# Patient Record
Sex: Male | Born: 1980
Health system: Southern US, Community
[De-identification: ages and names within clinical notes are randomized; demographics above are authoritative.]

## PROBLEM LIST (undated history)

## (undated) DIAGNOSIS — E119 Type 2 diabetes mellitus without complications: Secondary | ICD-10-CM

## (undated) DIAGNOSIS — E559 Vitamin D deficiency, unspecified: Secondary | ICD-10-CM

## (undated) DIAGNOSIS — S73004A Unspecified dislocation of right hip, initial encounter: Secondary | ICD-10-CM

## (undated) DIAGNOSIS — I10 Essential (primary) hypertension: Secondary | ICD-10-CM

## (undated) DIAGNOSIS — S82041A Displaced comminuted fracture of right patella, initial encounter for closed fracture: Secondary | ICD-10-CM

## (undated) DIAGNOSIS — S82891A Other fracture of right lower leg, initial encounter for closed fracture: Secondary | ICD-10-CM

## (undated) DIAGNOSIS — E785 Hyperlipidemia, unspecified: Secondary | ICD-10-CM

## (undated) HISTORY — DX: Hyperlipidemia, unspecified: E78.5

## (undated) HISTORY — DX: Essential (primary) hypertension: I10

---

## 2004-06-21 ENCOUNTER — Emergency Department (HOSPITAL_COMMUNITY): Admission: EM | Admit: 2004-06-21 | Discharge: 2004-06-21 | Payer: Self-pay | Admitting: Emergency Medicine

## 2006-09-06 ENCOUNTER — Emergency Department (HOSPITAL_COMMUNITY): Admission: EM | Admit: 2006-09-06 | Discharge: 2006-09-07 | Payer: Self-pay | Admitting: Emergency Medicine

## 2006-09-13 ENCOUNTER — Emergency Department (HOSPITAL_COMMUNITY): Admission: EM | Admit: 2006-09-13 | Discharge: 2006-09-14 | Payer: Self-pay | Admitting: Emergency Medicine

## 2008-04-14 ENCOUNTER — Emergency Department (HOSPITAL_COMMUNITY): Admission: EM | Admit: 2008-04-14 | Discharge: 2008-04-14 | Payer: Self-pay | Admitting: Emergency Medicine

## 2010-06-17 ENCOUNTER — Emergency Department (HOSPITAL_COMMUNITY): Admission: EM | Admit: 2010-06-17 | Discharge: 2010-06-17 | Payer: Self-pay | Admitting: Family Medicine

## 2010-06-19 ENCOUNTER — Emergency Department (HOSPITAL_COMMUNITY): Admission: EM | Admit: 2010-06-19 | Discharge: 2010-06-19 | Payer: Self-pay | Admitting: Family Medicine

## 2017-03-05 ENCOUNTER — Emergency Department (HOSPITAL_COMMUNITY): Payer: BLUE CROSS/BLUE SHIELD

## 2017-03-05 ENCOUNTER — Emergency Department (HOSPITAL_COMMUNITY): Payer: BLUE CROSS/BLUE SHIELD | Admitting: Certified Registered"

## 2017-03-05 ENCOUNTER — Inpatient Hospital Stay (HOSPITAL_COMMUNITY)
Admission: EM | Admit: 2017-03-05 | Discharge: 2017-03-16 | DRG: 481 | Disposition: A | Discharge status: Skilled Nursing Facility | Payer: BLUE CROSS/BLUE SHIELD | Attending: General Surgery | Admitting: General Surgery

## 2017-03-05 ENCOUNTER — Encounter (HOSPITAL_COMMUNITY): Admission: EM | Disposition: A | Discharge status: Skilled Nursing Facility | Payer: Self-pay | Source: Home / Self Care

## 2017-03-05 ENCOUNTER — Inpatient Hospital Stay (HOSPITAL_COMMUNITY): Payer: BLUE CROSS/BLUE SHIELD

## 2017-03-05 ENCOUNTER — Encounter (HOSPITAL_COMMUNITY): Payer: Self-pay | Admitting: Surgery

## 2017-03-05 DIAGNOSIS — R9431 Abnormal electrocardiogram [ECG] [EKG]: Secondary | ICD-10-CM | POA: Diagnosis not present

## 2017-03-05 DIAGNOSIS — R748 Abnormal levels of other serum enzymes: Secondary | ICD-10-CM | POA: Diagnosis not present

## 2017-03-05 DIAGNOSIS — S2241XA Multiple fractures of ribs, right side, initial encounter for closed fracture: Secondary | ICD-10-CM | POA: Diagnosis present

## 2017-03-05 DIAGNOSIS — T148XXA Other injury of unspecified body region, initial encounter: Secondary | ICD-10-CM

## 2017-03-05 DIAGNOSIS — S82041A Displaced comminuted fracture of right patella, initial encounter for closed fracture: Secondary | ICD-10-CM | POA: Diagnosis present

## 2017-03-05 DIAGNOSIS — Z833 Family history of diabetes mellitus: Secondary | ICD-10-CM | POA: Diagnosis not present

## 2017-03-05 DIAGNOSIS — R0602 Shortness of breath: Secondary | ICD-10-CM

## 2017-03-05 DIAGNOSIS — Z09 Encounter for follow-up examination after completed treatment for conditions other than malignant neoplasm: Secondary | ICD-10-CM

## 2017-03-05 DIAGNOSIS — S73004A Unspecified dislocation of right hip, initial encounter: Principal | ICD-10-CM | POA: Diagnosis present

## 2017-03-05 DIAGNOSIS — S301XXA Contusion of abdominal wall, initial encounter: Secondary | ICD-10-CM | POA: Diagnosis present

## 2017-03-05 DIAGNOSIS — E54 Ascorbic acid deficiency: Secondary | ICD-10-CM | POA: Diagnosis present

## 2017-03-05 DIAGNOSIS — S91311A Laceration without foreign body, right foot, initial encounter: Secondary | ICD-10-CM | POA: Diagnosis present

## 2017-03-05 DIAGNOSIS — E1065 Type 1 diabetes mellitus with hyperglycemia: Secondary | ICD-10-CM | POA: Diagnosis present

## 2017-03-05 DIAGNOSIS — S61216A Laceration without foreign body of right little finger without damage to nail, initial encounter: Secondary | ICD-10-CM | POA: Diagnosis present

## 2017-03-05 DIAGNOSIS — T1490XA Injury, unspecified, initial encounter: Secondary | ICD-10-CM

## 2017-03-05 DIAGNOSIS — T07XXXA Unspecified multiple injuries, initial encounter: Secondary | ICD-10-CM

## 2017-03-05 DIAGNOSIS — D62 Acute posthemorrhagic anemia: Secondary | ICD-10-CM | POA: Diagnosis not present

## 2017-03-05 DIAGNOSIS — S82891A Other fracture of right lower leg, initial encounter for closed fracture: Secondary | ICD-10-CM | POA: Diagnosis present

## 2017-03-05 DIAGNOSIS — E872 Acidosis: Secondary | ICD-10-CM | POA: Diagnosis present

## 2017-03-05 DIAGNOSIS — Z419 Encounter for procedure for purposes other than remedying health state, unspecified: Secondary | ICD-10-CM

## 2017-03-05 DIAGNOSIS — S270XXA Traumatic pneumothorax, initial encounter: Secondary | ICD-10-CM | POA: Diagnosis present

## 2017-03-05 DIAGNOSIS — E1165 Type 2 diabetes mellitus with hyperglycemia: Secondary | ICD-10-CM | POA: Diagnosis not present

## 2017-03-05 DIAGNOSIS — Y92410 Unspecified street and highway as the place of occurrence of the external cause: Secondary | ICD-10-CM

## 2017-03-05 DIAGNOSIS — Z880 Allergy status to penicillin: Secondary | ICD-10-CM

## 2017-03-05 DIAGNOSIS — S62002A Unspecified fracture of navicular [scaphoid] bone of left wrist, initial encounter for closed fracture: Secondary | ICD-10-CM | POA: Diagnosis present

## 2017-03-05 DIAGNOSIS — S2239XA Fracture of one rib, unspecified side, initial encounter for closed fracture: Secondary | ICD-10-CM

## 2017-03-05 DIAGNOSIS — S2249XA Multiple fractures of ribs, unspecified side, initial encounter for closed fracture: Secondary | ICD-10-CM

## 2017-03-05 DIAGNOSIS — S82841A Displaced bimalleolar fracture of right lower leg, initial encounter for closed fracture: Secondary | ICD-10-CM | POA: Diagnosis present

## 2017-03-05 DIAGNOSIS — E559 Vitamin D deficiency, unspecified: Secondary | ICD-10-CM | POA: Diagnosis present

## 2017-03-05 DIAGNOSIS — R079 Chest pain, unspecified: Secondary | ICD-10-CM

## 2017-03-05 DIAGNOSIS — M25551 Pain in right hip: Secondary | ICD-10-CM | POA: Diagnosis present

## 2017-03-05 DIAGNOSIS — S50811A Abrasion of right forearm, initial encounter: Secondary | ICD-10-CM | POA: Diagnosis present

## 2017-03-05 DIAGNOSIS — S81801A Unspecified open wound, right lower leg, initial encounter: Secondary | ICD-10-CM | POA: Diagnosis present

## 2017-03-05 DIAGNOSIS — E119 Type 2 diabetes mellitus without complications: Secondary | ICD-10-CM

## 2017-03-05 HISTORY — PX: HIP CLOSED REDUCTION: SHX983

## 2017-03-05 HISTORY — DX: Other fracture of right lower leg, initial encounter for closed fracture: S82.891A

## 2017-03-05 HISTORY — DX: Unspecified dislocation of right hip, initial encounter: S73.004A

## 2017-03-05 HISTORY — DX: Displaced comminuted fracture of right patella, initial encounter for closed fracture: S82.041A

## 2017-03-05 HISTORY — DX: Vitamin D deficiency, unspecified: E55.9

## 2017-03-05 HISTORY — PX: I & D EXTREMITY: SHX5045

## 2017-03-05 LAB — CBC
HEMATOCRIT: 47.3 % (ref 39.0–52.0)
Hemoglobin: 16.6 g/dL (ref 13.0–17.0)
MCH: 31.4 pg (ref 26.0–34.0)
MCHC: 35.1 g/dL (ref 30.0–36.0)
MCV: 89.4 fL (ref 78.0–100.0)
Platelets: 239 10*3/uL (ref 150–400)
RBC: 5.29 MIL/uL (ref 4.22–5.81)
RDW: 12.6 % (ref 11.5–15.5)
WBC: 11.2 10*3/uL — ABNORMAL HIGH (ref 4.0–10.5)

## 2017-03-05 LAB — LACTIC ACID, PLASMA: Lactic Acid, Venous: 5.4 mmol/L (ref 0.5–1.9)

## 2017-03-05 LAB — COMPREHENSIVE METABOLIC PANEL
ALBUMIN: 3.8 g/dL (ref 3.5–5.0)
ALK PHOS: 82 U/L (ref 38–126)
ALT: 321 U/L — ABNORMAL HIGH (ref 17–63)
AST: 484 U/L — AB (ref 15–41)
Anion gap: 13 (ref 5–15)
BILIRUBIN TOTAL: 1.3 mg/dL — AB (ref 0.3–1.2)
BUN: 10 mg/dL (ref 6–20)
CALCIUM: 9.3 mg/dL (ref 8.9–10.3)
CO2: 22 mmol/L (ref 22–32)
Chloride: 101 mmol/L (ref 101–111)
Creatinine, Ser: 0.84 mg/dL (ref 0.61–1.24)
GFR calc Af Amer: >60 mL/min (ref >60)
GFR calc non Af Amer: >60 mL/min (ref >60)
GLUCOSE: 401 mg/dL — AB (ref 65–99)
Potassium: 3.5 mmol/L (ref 3.5–5.1)
SODIUM: 136 mmol/L (ref 135–145)
TOTAL PROTEIN: 7 g/dL (ref 6.5–8.1)

## 2017-03-05 LAB — I-STAT CHEM 8, ED
BUN: 14 mg/dL (ref 6–20)
CHLORIDE: 104 mmol/L (ref 101–111)
Calcium, Ion: 1.1 mmol/L — ABNORMAL LOW (ref 1.15–1.40)
Creatinine, Ser: 0.7 mg/dL (ref 0.61–1.24)
Glucose, Bld: 401 mg/dL — ABNORMAL HIGH (ref 65–99)
HEMATOCRIT: 48 % (ref 39.0–52.0)
Hemoglobin: 16.3 g/dL (ref 13.0–17.0)
POTASSIUM: 3.4 mmol/L — AB (ref 3.5–5.1)
SODIUM: 138 mmol/L (ref 135–145)
TCO2: 24 mmol/L (ref 0–100)

## 2017-03-05 LAB — PROTIME-INR
INR: 0.98
Prothrombin Time: 13 seconds (ref 11.4–15.2)

## 2017-03-05 LAB — GLUCOSE, CAPILLARY
GLUCOSE-CAPILLARY: 331 mg/dL — AB (ref 65–99)
GLUCOSE-CAPILLARY: 271 mg/dL — AB (ref 65–99)
GLUCOSE-CAPILLARY: 333 mg/dL — AB (ref 65–99)
Glucose-Capillary: 348 mg/dL — ABNORMAL HIGH (ref 65–99)

## 2017-03-05 LAB — I-STAT CG4 LACTIC ACID, ED: LACTIC ACID, VENOUS: 4.91 mmol/L — AB (ref 0.5–1.9)

## 2017-03-05 LAB — SAMPLE TO BLOOD BANK

## 2017-03-05 LAB — ETHANOL: Alcohol, Ethyl (B): <5 mg/dL (ref <5)

## 2017-03-05 LAB — CDS SEROLOGY

## 2017-03-05 SURGERY — IRRIGATION AND DEBRIDEMENT EXTREMITY
Anesthesia: General | Laterality: Right

## 2017-03-05 MED ORDER — IOPAMIDOL (ISOVUE-300) INJECTION 61%
INTRAVENOUS | Status: AC
  Filled 2017-03-05: qty 100

## 2017-03-05 MED ORDER — MIDAZOLAM HCL 2 MG/2ML IJ SOLN
INTRAMUSCULAR | Status: AC
  Filled 2017-03-05: qty 2

## 2017-03-05 MED ORDER — SUGAMMADEX SODIUM 200 MG/2ML IV SOLN
INTRAVENOUS | Status: DC | PRN
  Administered 2017-03-05: 215 mg via INTRAVENOUS

## 2017-03-05 MED ORDER — CEFAZOLIN SODIUM-DEXTROSE 2-4 GM/100ML-% IV SOLN
2.0000 g | Freq: Once | INTRAVENOUS | Status: AC
  Administered 2017-03-05: 2 g via INTRAVENOUS

## 2017-03-05 MED ORDER — HYDROMORPHONE HCL 1 MG/ML IJ SOLN: 0.2500 mg | INTRAMUSCULAR | Status: DC | PRN

## 2017-03-05 MED ORDER — CEFAZOLIN SODIUM 1 G IJ SOLR
INTRAMUSCULAR | Status: AC
  Filled 2017-03-05: qty 20

## 2017-03-05 MED ORDER — OXYCODONE HCL 5 MG/5ML PO SOLN: 5.0000 mg | Freq: Once | ORAL | Status: DC | PRN

## 2017-03-05 MED ORDER — ROCURONIUM BROMIDE 10 MG/ML (PF) SYRINGE
PREFILLED_SYRINGE | INTRAVENOUS | Status: AC
  Filled 2017-03-05: qty 5

## 2017-03-05 MED ORDER — FENTANYL CITRATE (PF) 100 MCG/2ML IJ SOLN
INTRAMUSCULAR | Status: AC
  Administered 2017-03-05: 100 ug
  Filled 2017-03-05: qty 2

## 2017-03-05 MED ORDER — PROPOFOL 10 MG/ML IV BOLUS
INTRAVENOUS | Status: AC
  Filled 2017-03-05: qty 20

## 2017-03-05 MED ORDER — HYDROMORPHONE HCL 1 MG/ML IJ SOLN
1.0000 mg | INTRAMUSCULAR | Status: DC | PRN
  Administered 2017-03-06 – 2017-03-13 (×18): 1 mg via INTRAVENOUS
  Filled 2017-03-05 (×18): qty 1

## 2017-03-05 MED ORDER — ROCURONIUM BROMIDE 100 MG/10ML IV SOLN
INTRAVENOUS | Status: DC | PRN
  Administered 2017-03-05: 30 mg via INTRAVENOUS
  Administered 2017-03-05: 100 mg via INTRAVENOUS

## 2017-03-05 MED ORDER — PROPOFOL 10 MG/ML IV BOLUS
1.0000 mg/kg | Freq: Once | INTRAVENOUS | Status: AC
Start: 2017-03-05 — End: 2017-03-05
  Administered 2017-03-05: 100 mg via INTRAVENOUS
  Filled 2017-03-05: qty 20

## 2017-03-05 MED ORDER — CEFAZOLIN SODIUM 1 G IJ SOLR
INTRAMUSCULAR | Status: DC | PRN
  Administered 2017-03-05: 2 g via INTRAMUSCULAR

## 2017-03-05 MED ORDER — MIDAZOLAM HCL 2 MG/2ML IJ SOLN
INTRAMUSCULAR | Status: DC | PRN
  Administered 2017-03-05 (×2): 2 mg via INTRAVENOUS

## 2017-03-05 MED ORDER — ONDANSETRON HCL 4 MG/2ML IJ SOLN
INTRAMUSCULAR | Status: DC | PRN
  Administered 2017-03-05: 4 mg via INTRAVENOUS

## 2017-03-05 MED ORDER — OXYCODONE HCL 5 MG PO TABS
5.0000 mg | ORAL_TABLET | ORAL | Status: DC | PRN
  Administered 2017-03-05 – 2017-03-06 (×2): 10 mg via ORAL
  Filled 2017-03-05 (×3): qty 2

## 2017-03-05 MED ORDER — SODIUM CHLORIDE 0.9 % IR SOLN
Status: DC | PRN
  Administered 2017-03-05: 3000 mL

## 2017-03-05 MED ORDER — OXYCODONE HCL 5 MG PO TABS: 5.0000 mg | ORAL_TABLET | Freq: Once | ORAL | Status: DC | PRN

## 2017-03-05 MED ORDER — FENTANYL CITRATE (PF) 250 MCG/5ML IJ SOLN
INTRAMUSCULAR | Status: AC
  Filled 2017-03-05: qty 5

## 2017-03-05 MED ORDER — PANTOPRAZOLE SODIUM 40 MG IV SOLR
40.0000 mg | Freq: Every day | INTRAVENOUS | Status: DC
  Filled 2017-03-05: qty 40

## 2017-03-05 MED ORDER — DOCUSATE SODIUM 100 MG PO CAPS
100.0000 mg | ORAL_CAPSULE | Freq: Two times a day (BID) | ORAL | Status: DC
  Administered 2017-03-05 – 2017-03-16 (×18): 100 mg via ORAL
  Filled 2017-03-05 (×22): qty 1

## 2017-03-05 MED ORDER — PROPOFOL 10 MG/ML IV BOLUS: 100.0000 mg | Freq: Once | INTRAVENOUS | Status: DC

## 2017-03-05 MED ORDER — INSULIN ASPART 100 UNIT/ML ~~LOC~~ SOLN
0.0000 [IU] | Freq: Every day | SUBCUTANEOUS | Status: DC
  Administered 2017-03-05: 4 [IU] via SUBCUTANEOUS
  Administered 2017-03-06: 2 [IU] via SUBCUTANEOUS
  Administered 2017-03-10: 3 [IU] via SUBCUTANEOUS

## 2017-03-05 MED ORDER — INSULIN ASPART 100 UNIT/ML ~~LOC~~ SOLN
0.0000 [IU] | Freq: Three times a day (TID) | SUBCUTANEOUS | Status: DC
  Administered 2017-03-06: 5 [IU] via SUBCUTANEOUS
  Administered 2017-03-06: 8 [IU] via SUBCUTANEOUS
  Administered 2017-03-06: 5 [IU] via SUBCUTANEOUS
  Administered 2017-03-07 (×2): 3 [IU] via SUBCUTANEOUS
  Administered 2017-03-07 – 2017-03-08 (×2): 5 [IU] via SUBCUTANEOUS
  Administered 2017-03-08 – 2017-03-09 (×3): 3 [IU] via SUBCUTANEOUS
  Administered 2017-03-10: 8 [IU] via SUBCUTANEOUS
  Administered 2017-03-10 (×2): 5 [IU] via SUBCUTANEOUS
  Administered 2017-03-11: 8 [IU] via SUBCUTANEOUS
  Administered 2017-03-11: 3 [IU] via SUBCUTANEOUS
  Administered 2017-03-11: 8 [IU] via SUBCUTANEOUS
  Administered 2017-03-12: 5 [IU] via SUBCUTANEOUS
  Administered 2017-03-12: 8 [IU] via SUBCUTANEOUS
  Administered 2017-03-12: 3 [IU] via SUBCUTANEOUS

## 2017-03-05 MED ORDER — FENTANYL CITRATE (PF) 100 MCG/2ML IJ SOLN
INTRAMUSCULAR | Status: AC
  Administered 2017-03-05: 100 ug via INTRAVENOUS
  Filled 2017-03-05: qty 2

## 2017-03-05 MED ORDER — FENTANYL CITRATE (PF) 100 MCG/2ML IJ SOLN
100.0000 ug | Freq: Once | INTRAMUSCULAR | Status: AC
  Administered 2017-03-05: 100 ug via INTRAVENOUS

## 2017-03-05 MED ORDER — ONDANSETRON HCL 4 MG/2ML IJ SOLN
INTRAMUSCULAR | Status: AC
  Filled 2017-03-05: qty 2

## 2017-03-05 MED ORDER — IOPAMIDOL (ISOVUE-300) INJECTION 61%
INTRAVENOUS | Status: AC
  Administered 2017-03-05: 100 mL
  Filled 2017-03-05: qty 100

## 2017-03-05 MED ORDER — FENTANYL CITRATE (PF) 100 MCG/2ML IJ SOLN
INTRAMUSCULAR | Status: DC | PRN
  Administered 2017-03-05: 150 ug via INTRAVENOUS
  Administered 2017-03-05 (×2): 50 ug via INTRAVENOUS

## 2017-03-05 MED ORDER — LACTATED RINGERS IV SOLN
INTRAVENOUS | Status: DC
  Administered 2017-03-05 (×2): via INTRAVENOUS

## 2017-03-05 MED ORDER — LIDOCAINE 2% (20 MG/ML) 5 ML SYRINGE
INTRAMUSCULAR | Status: AC
  Filled 2017-03-05: qty 5

## 2017-03-05 MED ORDER — SODIUM CHLORIDE 0.9 % IV SOLN
INTRAVENOUS | Status: DC
  Administered 2017-03-05 – 2017-03-13 (×8): via INTRAVENOUS

## 2017-03-05 MED ORDER — PROPOFOL 10 MG/ML IV BOLUS
INTRAVENOUS | Status: AC | PRN
  Administered 2017-03-05: 100 mg via INTRAVENOUS

## 2017-03-05 MED ORDER — ONDANSETRON HCL 4 MG/2ML IJ SOLN: 4.0000 mg | Freq: Four times a day (QID) | INTRAMUSCULAR | Status: DC | PRN

## 2017-03-05 MED ORDER — TETANUS-DIPHTH-ACELL PERTUSSIS 5-2.5-18.5 LF-MCG/0.5 IM SUSP
INTRAMUSCULAR | Status: AC
  Administered 2017-03-05: 0.5 mL via INTRAMUSCULAR
  Filled 2017-03-05: qty 0.5

## 2017-03-05 MED ORDER — ONDANSETRON HCL 4 MG PO TABS: 4.0000 mg | ORAL_TABLET | Freq: Four times a day (QID) | ORAL | Status: DC | PRN

## 2017-03-05 MED ORDER — PANTOPRAZOLE SODIUM 40 MG PO TBEC
40.0000 mg | DELAYED_RELEASE_TABLET | Freq: Every day | ORAL | Status: DC
  Administered 2017-03-06 – 2017-03-16 (×10): 40 mg via ORAL
  Filled 2017-03-05 (×11): qty 1

## 2017-03-05 MED ORDER — CLINDAMYCIN PHOSPHATE 600 MG/50ML IV SOLN
600.0000 mg | Freq: Two times a day (BID) | INTRAVENOUS | Status: AC
  Administered 2017-03-05 – 2017-03-07 (×4): 600 mg via INTRAVENOUS
  Filled 2017-03-05 (×4): qty 50

## 2017-03-05 MED ORDER — ACETAMINOPHEN 325 MG PO TABS: 650.0000 mg | ORAL_TABLET | ORAL | Status: DC | PRN

## 2017-03-05 MED ORDER — PROPOFOL 10 MG/ML IV BOLUS
INTRAVENOUS | Status: AC | PRN
  Administered 2017-03-05: 107 mg via INTRAVENOUS

## 2017-03-05 MED ORDER — TETANUS-DIPHTH-ACELL PERTUSSIS 5-2.5-18.5 LF-MCG/0.5 IM SUSP
0.5000 mL | Freq: Once | INTRAMUSCULAR | Status: AC
  Administered 2017-03-05: 0.5 mL via INTRAMUSCULAR

## 2017-03-05 SURGICAL SUPPLY — 59 items
BANDAGE ACE 6X5 VEL STRL LF (GAUZE/BANDAGES/DRESSINGS) ×6 IMPLANT
BNDG COHESIVE 4X5 TAN STRL (GAUZE/BANDAGES/DRESSINGS) ×3 IMPLANT
BNDG GAUZE ELAST 4 BULKY (GAUZE/BANDAGES/DRESSINGS) ×4 IMPLANT
BNDG GAUZE STRTCH 6 (GAUZE/BANDAGES/DRESSINGS) ×9 IMPLANT
BRUSH SCRUB SURG 4.25 DISP (MISCELLANEOUS) ×6 IMPLANT
COVER PERINEAL POST (MISCELLANEOUS) ×3 IMPLANT
COVER SURGICAL LIGHT HANDLE (MISCELLANEOUS) ×6 IMPLANT
DRAPE C-ARMOR (DRAPES) ×3 IMPLANT
DRAPE STERI IOBAN 125X83 (DRAPES) ×3 IMPLANT
DRAPE U-SHAPE 47X51 STRL (DRAPES) ×3 IMPLANT
DRSG ADAPTIC 3X8 NADH LF (GAUZE/BANDAGES/DRESSINGS) ×3 IMPLANT
DRSG MEPILEX BORDER 4X4 (GAUZE/BANDAGES/DRESSINGS) ×3 IMPLANT
DRSG MEPITEL 4X7.2 (GAUZE/BANDAGES/DRESSINGS) ×8 IMPLANT
ELECT REM PT RETURN 9FT ADLT (ELECTROSURGICAL) ×3
ELECTRODE REM PT RTRN 9FT ADLT (ELECTROSURGICAL) ×1 IMPLANT
GAUZE SPONGE 4X4 12PLY STRL (GAUZE/BANDAGES/DRESSINGS) ×5 IMPLANT
GLOVE BIO SURGEON STRL SZ7.5 (GLOVE) ×3 IMPLANT
GLOVE BIO SURGEON STRL SZ8 (GLOVE) ×3 IMPLANT
GLOVE BIOGEL PI IND STRL 7.5 (GLOVE) ×1 IMPLANT
GLOVE BIOGEL PI IND STRL 8 (GLOVE) ×1 IMPLANT
GLOVE BIOGEL PI INDICATOR 7.5 (GLOVE) ×2
GLOVE BIOGEL PI INDICATOR 8 (GLOVE) ×2
GOWN STRL REUS W/ TWL LRG LVL3 (GOWN DISPOSABLE) ×2 IMPLANT
GOWN STRL REUS W/ TWL XL LVL3 (GOWN DISPOSABLE) ×1 IMPLANT
GOWN STRL REUS W/TWL LRG LVL3 (GOWN DISPOSABLE) ×6
GOWN STRL REUS W/TWL XL LVL3 (GOWN DISPOSABLE) ×3
HANDPIECE INTERPULSE COAX TIP (DISPOSABLE)
KIT BASIN OR (CUSTOM PROCEDURE TRAY) ×3 IMPLANT
KIT ROOM TURNOVER OR (KITS) ×3 IMPLANT
LINER BOOT UNIVERSAL DISP (MISCELLANEOUS) ×3 IMPLANT
MANIFOLD NEPTUNE II (INSTRUMENTS) ×3 IMPLANT
NS IRRIG 1000ML POUR BTL (IV SOLUTION) ×3 IMPLANT
PACK GENERAL/GYN (CUSTOM PROCEDURE TRAY) ×3 IMPLANT
PACK ORTHO EXTREMITY (CUSTOM PROCEDURE TRAY) ×3 IMPLANT
PAD ABD 8X10 STRL (GAUZE/BANDAGES/DRESSINGS) ×4 IMPLANT
PAD ARMBOARD 7.5X6 YLW CONV (MISCELLANEOUS) ×6 IMPLANT
PAD CAST 4YDX4 CTTN HI CHSV (CAST SUPPLIES) IMPLANT
PADDING CAST COTTON 4X4 STRL (CAST SUPPLIES) ×9
PADDING CAST COTTON 6X4 STRL (CAST SUPPLIES) ×3 IMPLANT
SET HNDPC FAN SPRY TIP SCT (DISPOSABLE) IMPLANT
SPONGE LAP 18X18 X RAY DECT (DISPOSABLE) ×3 IMPLANT
STAPLER VISISTAT 35W (STAPLE) ×3 IMPLANT
STOCKINETTE IMPERVIOUS 9X36 MD (GAUZE/BANDAGES/DRESSINGS) ×3 IMPLANT
SUT PDS AB 2-0 CT1 27 (SUTURE) IMPLANT
SUT VIC AB 0 CT1 27 (SUTURE) ×3
SUT VIC AB 0 CT1 27XBRD ANBCTR (SUTURE) ×1 IMPLANT
SUT VIC AB 1 CT1 27 (SUTURE) ×3
SUT VIC AB 1 CT1 27XBRD ANBCTR (SUTURE) ×1 IMPLANT
SUT VIC AB 2-0 CT1 27 (SUTURE) ×3
SUT VIC AB 2-0 CT1 TAPERPNT 27 (SUTURE) ×1 IMPLANT
SWAB CULTURE ESWAB REG 1ML (MISCELLANEOUS) IMPLANT
TOWEL OR 17X24 6PK STRL BLUE (TOWEL DISPOSABLE) ×3 IMPLANT
TOWEL OR 17X26 10 PK STRL BLUE (TOWEL DISPOSABLE) ×6 IMPLANT
TRAY FOLEY CATH 14FRSI W/METER (CATHETERS) ×2 IMPLANT
TUBE CONNECTING 12'X1/4 (SUCTIONS) ×1
TUBE CONNECTING 12X1/4 (SUCTIONS) ×2 IMPLANT
UNDERPAD 30X30 (UNDERPADS AND DIAPERS) ×3 IMPLANT
WATER STERILE IRR 1000ML POUR (IV SOLUTION) ×3 IMPLANT
YANKAUER SUCT BULB TIP NO VENT (SUCTIONS) ×3 IMPLANT

## 2017-03-05 NOTE — Anesthesia Procedure Notes (Signed)
Procedure Name: Intubation Date/Time: 03/05/2017 5:42 PM Performed by: Sampson Si E Pre-anesthesia Checklist: Patient identified, Emergency Drugs available, Suction available and Patient being monitored Patient Re-evaluated:Patient Re-evaluated prior to inductionOxygen Delivery Method: Circle System Utilized Preoxygenation: Pre-oxygenation with 100% oxygen Intubation Type: IV induction Ventilation: Mask ventilation without difficulty Laryngoscope Size: Mac and 4 Grade View: Grade I Tube type: Oral Tube size: 7.5 mm Number of attempts: 1 Airway Equipment and Method: Stylet and Oral airway Placement Confirmation: ETT inserted through vocal cords under direct vision,  positive ETCO2 and breath sounds checked- equal and bilateral Secured at: 21 cm Tube secured with: Tape Dental Injury: Teeth and Oropharynx as per pre-operative assessment
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
## 2017-03-05 NOTE — ED Notes (Signed)
MD wyatt at bedside. Report called to OR.

## 2017-03-05 NOTE — Anesthesia Preprocedure Evaluation (Addendum)
Anesthesia Evaluation  Patient identified by MRN, date of birth, ID band Patient awake    Reviewed: Allergy & Precautions, NPO status , Patient's Chart, lab work & pertinent test results  History of Anesthesia Complications Negative for: history of anesthetic complications  Airway Mallampati: II  TM Distance: >3 FB Neck ROM: Full    Dental  (+) Teeth Intact, Chipped, Dental Advisory Given,    Pulmonary neg pulmonary ROS,    breath sounds clear to auscultation       Cardiovascular negative cardio ROS   Rhythm:Regular     Neuro/Psych negative neurological ROS  negative psych ROS   GI/Hepatic negative GI ROS, Neg liver ROS,   Endo/Other  negative endocrine ROS  Renal/GU negative Renal ROS     Musculoskeletal   Abdominal   Peds  Hematology negative hematology ROS (+)   Anesthesia Other Findings   Reproductive/Obstetrics                            Anesthesia Physical Anesthesia Plan  ASA: I  Anesthesia Plan: General   Post-op Pain Management:    Induction: Intravenous, Rapid sequence and Cricoid pressure planned  Airway Management Planned: Oral ETT  Additional Equipment: None  Intra-op Plan:   Post-operative Plan: Extubation in OR and Possible Post-op intubation/ventilation  Informed Consent: I have reviewed the patients History and Physical, chart, labs and discussed the procedure including the risks, benefits and alternatives for the proposed anesthesia with the patient or authorized representative who has indicated his/her understanding and acceptance.   Dental advisory given  Plan Discussed with: CRNA and Surgeon  Anesthesia Plan Comments:         Anesthesia Quick Evaluation
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
## 2017-03-05 NOTE — Progress Notes (Signed)
   03/05/17 1135  Clinical Encounter Type  Visited With Patient  Visit Type ED  Spiritual Encounters  Spiritual Needs Emotional  Stress Factors  Patient Stress Factors Health changes  Introduction to Pt. Got number of relative to call. Left voice message.

## 2017-03-05 NOTE — Brief Op Note (Signed)
03/05/2017  8:14 PM  PATIENT:  Walter Hansen  36 y.o. male  PRE-OPERATIVE DIAGNOSES:   1. RIGHT HIP TRAUMATIC AVULSION/ IMPACTION FRACTURE DISLOCATION 2. RIGHT LEG WOUND WITH DISRUPTED TIBIAL PERIOSTEUM  3. RIGHT ANKLE BIMALLEOLAR FRACTURE 4. RIGHT ANKLE POSTERIOR LACERATION 7 CM 5. RIGHT RING AND SMALL FINGER LACERATIONS 6. LEFT THUMB LACERATION 7. LEFT SCAPHOID FRACTURE 8. RIGHT PATELLA FRACTURE  POST-OPERATIVE DIAGNOSIS:   1. RIGHT HIP TRAUMATIC AVULSION/ IMPACTION FRACTURE DISLOCATION 2. RIGHT LEG WOUND WITH DISRUPTED TIBIAL PERIOSTEUM  3. RIGHT ANKLE BIMALLEOLAR FRACTURE 4. RIGHT ANKLE POSTERIOR LACERATION 7 CM 5. RIGHT RING AND SMALL FINGER LACERATIONS 6. LEFT THUMB LACERATION 7. LEFT SCAPHOID FRACTURE 8. RIGHT PATELLA FRACTURE  PROCEDURE:  Procedure(s): 1. CLOSED REDUCTION OF RIGHT HIP TRAUMATIC DISLOCATION 2. IRRIGATION AND DEBRIDEMENT RIGHT TIBIA FRACTURE, SKIN, SUBCU, FASCIA 3. IRRIGATION AND DEBRIDEMENT ANKLE AND LEG (Right) TRAUMATIC WOUND 4. LAYERED CLOSURE RIGHT ANKLE 7CM 5. IRRIGATION AND DEBRIDEMENT RING AND SMALL FINGER (Right) TRAUMATIC WOUNDS 6. DEBRIDEMENT RIGHT RING AND SMALL FINGER WOUNDS, LEFT THUMB WOUND SKIN AND SUBCU  SURGEON:  Surgeon(s) and Role:    [name]* Jeydan Barner, MD - Primary  PHYSICIAN ASSISTANT: Montez MoritaKEITH PAUL, PA-C  ANESTHESIA:   general  EBL:  Total I/O In: 2500 [I.V.:2500] Out: 130 [Urine:100; Blood:30]  BLOOD ADMINISTERED:none  DRAINS: none   LOCAL MEDICATIONS USED:  NONE  SPECIMEN:  No Specimen  DISPOSITION OF SPECIMEN:  N/A  COUNTS:  YES  TOURNIQUET:  * No tourniquets in log *  DICTATIO: [id number]: [id number]  PLAN OF CARE: Admit to inpatient   PATIENT DISPOSITION:  PACU - hemodynamically stable.   Delay start of Pharmacological VTE agent (>24hrs) due to surgical blood loss or risk of bleeding: no

## 2017-03-05 NOTE — ED Notes (Signed)
Right hip still out of place on repeat xray. Will repeat conscious sedation and repeat procedure.

## 2017-03-05 NOTE — Progress Notes (Signed)
Orthopedic Tech Progress Note Patient Details:  Walter HaradaWilliam Hansen 03/27/1981 [id number]  Musculoskeletal Traction Type of Traction: Other (Comment) Traction Location: traction setup on bed as requested by Doctor.  10lbs and 5lbs provided. Traction Weight: 10 lbs    Walter ChouWilliams, Walter Hansen 03/05/2017, 5:36 PM

## 2017-03-05 NOTE — ED Notes (Signed)
Lab results turned in to Dr. Jeraldine LootsLockwood.

## 2017-03-05 NOTE — ED Notes (Signed)
MD Jeraldine LootsLockwood and Ortho PA Dale DurhamMichael Jeffries unable to reduce hip. Plan to go to OR.

## 2017-03-05 NOTE — Transfer of Care (Signed)
Immediate Anesthesia Transfer of Care Note  Patient: Walter Hansen  Procedure(s) Performed: Procedure(s): IRRIGATION AND DEBRIDEMENT ANKLE AND LEG (Right) CLOSED REDUCTION HIP (Right)  Patient Location: PACU  Anesthesia Type:General  Level of Consciousness: awake and alert   Airway & Oxygen Therapy: Patient Spontanous Breathing and Patient connected to nasal cannula oxygen  Post-op Assessment: Report given to RN and Post -op Vital signs reviewed and stable  Post vital signs: Reviewed and stable  Last Vitals:  Vitals:   03/05/17 1500 03/05/17 1554  BP: (!) 108/55   Pulse: (!) 116   Resp: (!) 33   Temp:  36.7 C    Last Pain:  Vitals:   03/05/17 1554  TempSrc: Tympanic  PainSc:          Complications: No apparent anesthesia complications

## 2017-03-05 NOTE — ED Notes (Signed)
Ortho PA at bedside.  

## 2017-03-05 NOTE — ED Notes (Signed)
CT unable to scan pt at this time.

## 2017-03-05 NOTE — Progress Notes (Signed)
Orthopedic Tech Progress Note Patient Details:  Walter HaradaWilliam Hansen 08/06/1981 [id number]  Ortho Devices Type of Ortho Device: Knee Immobilizer Ortho Device/Splint Location: rle Ortho Device/Splint Interventions: Application   Edson Deridder 03/05/2017, 3:07 PM

## 2017-03-05 NOTE — Progress Notes (Signed)
Orthopedic Tech Progress Note Patient Details:  Walter Hansen 04/04/1981 [id number]  Patient ID: Walter HaradaWilliam Hansen, male   DOB: 01/04/1981, 36 y.o.   MRN: [id number]   Walter DomCrawford, Walter Hansen 03/05/2017, 12:06 PM Made level 2 trauma visit

## 2017-03-05 NOTE — ED Notes (Signed)
Awaiting clear CT table.

## 2017-03-05 NOTE — Progress Notes (Signed)
Dr. Renold DonGermeroth made aware of patient's blood glucose 271.  No orders received.  Will continue to monitor.

## 2017-03-05 NOTE — Procedures (Signed)
Procedure: Closed reduction right hip x2  Indication: Right hip dislocation  Surgeon: Charma IgoMichael Demond Shallenberger, PA-C  Assist: Montez MoritaKeith Paul, PA-C  Anesthesia: Propofol (see conscious sedation note from Dr. Jeraldine LootsLockwood)  EBL: None  Complications: Unstable joint  Findings: Time out performed, conscious sedation administered. The hip was reduced fairly easily. A repeat x-ray showed it was still dislocated. Another attempt was made and again the hip relocated easily but this time it dislocated again quickly with release of traction. This happened again and the procedure was aborted. He tolerated the attempts well.    Freeman CaldronMichael J. Charlotte Fidalgo, PA-C Orthopedic Surgery [phone]
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
## 2017-03-06 ENCOUNTER — Inpatient Hospital Stay (HOSPITAL_COMMUNITY): Payer: BLUE CROSS/BLUE SHIELD

## 2017-03-06 ENCOUNTER — Encounter (HOSPITAL_COMMUNITY): Payer: Self-pay

## 2017-03-06 DIAGNOSIS — S82891A Other fracture of right lower leg, initial encounter for closed fracture: Secondary | ICD-10-CM

## 2017-03-06 DIAGNOSIS — S82041A Displaced comminuted fracture of right patella, initial encounter for closed fracture: Secondary | ICD-10-CM

## 2017-03-06 DIAGNOSIS — S91311A Laceration without foreign body, right foot, initial encounter: Secondary | ICD-10-CM | POA: Diagnosis present

## 2017-03-06 DIAGNOSIS — S73004A Unspecified dislocation of right hip, initial encounter: Secondary | ICD-10-CM

## 2017-03-06 DIAGNOSIS — S81801A Unspecified open wound, right lower leg, initial encounter: Secondary | ICD-10-CM | POA: Diagnosis present

## 2017-03-06 HISTORY — DX: Displaced comminuted fracture of right patella, initial encounter for closed fracture: S82.041A

## 2017-03-06 HISTORY — DX: Unspecified dislocation of right hip, initial encounter: S73.004A

## 2017-03-06 HISTORY — DX: Other fracture of right lower leg, initial encounter for closed fracture: S82.891A

## 2017-03-06 LAB — CBC
HCT: 35.7 % — ABNORMAL LOW (ref 39.0–52.0)
Hemoglobin: 12 g/dL — ABNORMAL LOW (ref 13.0–17.0)
MCH: 30.8 pg (ref 26.0–34.0)
MCHC: 33.6 g/dL (ref 30.0–36.0)
MCV: 91.5 fL (ref 78.0–100.0)
Platelets: 170 K/uL (ref 150–400)
RBC: 3.9 MIL/uL — ABNORMAL LOW (ref 4.22–5.81)
RDW: 12.8 % (ref 11.5–15.5)
WBC: 9.9 K/uL (ref 4.0–10.5)

## 2017-03-06 LAB — BASIC METABOLIC PANEL
Anion gap: 10 (ref 5–15)
BUN: 7 mg/dL (ref 6–20)
CALCIUM: 7.9 mg/dL — AB (ref 8.9–10.3)
CO2: 23 mmol/L (ref 22–32)
CREATININE: 0.67 mg/dL (ref 0.61–1.24)
Chloride: 105 mmol/L (ref 101–111)
GFR calc non Af Amer: >60 mL/min (ref >60)
Glucose, Bld: 276 mg/dL — ABNORMAL HIGH (ref 65–99)
Potassium: 3.7 mmol/L (ref 3.5–5.1)
SODIUM: 138 mmol/L (ref 135–145)

## 2017-03-06 LAB — GLUCOSE, CAPILLARY
GLUCOSE-CAPILLARY: 251 mg/dL — AB (ref 65–99)
GLUCOSE-CAPILLARY: 226 mg/dL — AB (ref 65–99)
GLUCOSE-CAPILLARY: 217 mg/dL — AB (ref 65–99)
GLUCOSE-CAPILLARY: 206 mg/dL — AB (ref 65–99)
Glucose-Capillary: 249 mg/dL — ABNORMAL HIGH (ref 65–99)

## 2017-03-06 LAB — LACTIC ACID, PLASMA: Lactic Acid, Venous: 1.2 mmol/L (ref 0.5–1.9)

## 2017-03-06 LAB — HIV ANTIBODY (ROUTINE TESTING W REFLEX): HIV Screen 4th Generation wRfx: NONREACTIVE

## 2017-03-06 MED ORDER — ENOXAPARIN SODIUM 40 MG/0.4ML ~~LOC~~ SOLN
40.0000 mg | SUBCUTANEOUS | Status: DC
  Administered 2017-03-06 – 2017-03-07 (×2): 40 mg via SUBCUTANEOUS
  Filled 2017-03-06 (×2): qty 0.4

## 2017-03-06 MED ORDER — SODIUM CHLORIDE 0.9 % IV BOLUS (SEPSIS)
1000.0000 mL | Freq: Once | INTRAVENOUS | Status: AC
  Administered 2017-03-06: 1000 mL via INTRAVENOUS

## 2017-03-06 NOTE — Progress Notes (Signed)
Back from CT

## 2017-03-06 NOTE — Progress Notes (Signed)
Patient's lactic acid last night 5.4; on call contacted. No new orders were given at that time (2300).  This morning lab called in regards to patient's hemoglobin dropping from 16.3 to 12.2 this morning.  No active bleeding with patient.  Lab will re-check hemoglobin this morning.

## 2017-03-06 NOTE — Progress Notes (Signed)
To CT Scan
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
## 2017-03-07 ENCOUNTER — Inpatient Hospital Stay (HOSPITAL_COMMUNITY): Payer: BLUE CROSS/BLUE SHIELD

## 2017-03-07 ENCOUNTER — Encounter (HOSPITAL_COMMUNITY): Payer: Self-pay

## 2017-03-07 DIAGNOSIS — E1165 Type 2 diabetes mellitus with hyperglycemia: Secondary | ICD-10-CM

## 2017-03-07 DIAGNOSIS — E119 Type 2 diabetes mellitus without complications: Secondary | ICD-10-CM

## 2017-03-07 LAB — CBC
HCT: 33.9 % — ABNORMAL LOW (ref 39.0–52.0)
HEMOGLOBIN: 11.1 g/dL — AB (ref 13.0–17.0)
MCH: 30.4 pg (ref 26.0–34.0)
MCHC: 32.7 g/dL (ref 30.0–36.0)
MCV: 92.9 fL (ref 78.0–100.0)
Platelets: 154 10*3/uL (ref 150–400)
RBC: 3.65 MIL/uL — ABNORMAL LOW (ref 4.22–5.81)
RDW: 12.4 % (ref 11.5–15.5)
WBC: 12.2 10*3/uL — AB (ref 4.0–10.5)

## 2017-03-07 LAB — COMPREHENSIVE METABOLIC PANEL
ALBUMIN: 2.5 g/dL — AB (ref 3.5–5.0)
ALT: 126 U/L — AB (ref 17–63)
AST: 61 U/L — AB (ref 15–41)
Alkaline Phosphatase: 57 U/L (ref 38–126)
Anion gap: 9 (ref 5–15)
BUN: 5 mg/dL — AB (ref 6–20)
CALCIUM: 7.9 mg/dL — AB (ref 8.9–10.3)
CHLORIDE: 101 mmol/L (ref 101–111)
CO2: 23 mmol/L (ref 22–32)
Creatinine, Ser: 0.69 mg/dL (ref 0.61–1.24)
GFR calc Af Amer: >60 mL/min (ref >60)
GLUCOSE: 205 mg/dL — AB (ref 65–99)
POTASSIUM: 3.7 mmol/L (ref 3.5–5.1)
SODIUM: 133 mmol/L — AB (ref 135–145)
TOTAL PROTEIN: 5.4 g/dL — AB (ref 6.5–8.1)
Total Bilirubin: 1.5 mg/dL — ABNORMAL HIGH (ref 0.3–1.2)

## 2017-03-07 LAB — HEMOGLOBIN A1C
Hgb A1c MFr Bld: 12.5 % — ABNORMAL HIGH (ref 4.8–5.6)
Mean Plasma Glucose: 312 mg/dL

## 2017-03-07 LAB — TROPONIN I: Troponin I: 0.09 ng/mL (ref <0.03)

## 2017-03-07 LAB — GLUCOSE, CAPILLARY
GLUCOSE-CAPILLARY: 211 mg/dL — AB (ref 65–99)
GLUCOSE-CAPILLARY: 188 mg/dL — AB (ref 65–99)
Glucose-Capillary: 199 mg/dL — ABNORMAL HIGH (ref 65–99)
Glucose-Capillary: 181 mg/dL — ABNORMAL HIGH (ref 65–99)

## 2017-03-07 LAB — MAGNESIUM: MAGNESIUM: 1.9 mg/dL (ref 1.7–2.4)

## 2017-03-07 MED ORDER — IOPAMIDOL (ISOVUE-370) INJECTION 76%
INTRAVENOUS | Status: AC
  Administered 2017-03-07: 100 mL
  Filled 2017-03-07: qty 100

## 2017-03-07 NOTE — Progress Notes (Signed)
MDs made aware of + troponin.  Pt no longer is c/o chest pain but does continue to have SOB and shallow breathing. He does state that his breathing has slightly improved since this am. Currently down in CT. Will continue to monitor for any changes.

## 2017-03-07 NOTE — Consult Note (Signed)
Patient had worsening SOB, and increased o2 needs and also c/o chest pain roughly 10 minutes ago. O2 was increased to 5 L, EKG taken and review by trauma PA. Pt sating in the 90, and vitals are stable.  Awaiting chest x ray and cardiac labs.  Will continue to monitor very closely for any worsening in symptoms. Currently lying in bed with family at bedside and call light in reach.

## 2017-03-07 NOTE — Plan of Care (Signed)
Problem: Safety: Goal: Ability to remain free from injury will improve Outcome: Progressing No incidence of falls during this admission. Call bell within reach. Bed in low and locked position. Family at bedside. Patient alert and oriented. 3/4 siderails in place. Patient on bedrest and NWB status. Clean and clear environment maintained. Patient verbalized understanding of safety instruction.  Problem: Pain Management: Goal: Pain level will decrease with appropriate interventions Outcome: Progressing Pain is being managed with PO pain medication. Vital signs are stable. Facial grimacing and moaning not present. Patient is resting well.
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
## 2017-03-07 NOTE — Progress Notes (Signed)
Orthopedic Tech Progress Note Patient Details:  Walter HaradaWilliam Hansen 01/18/1981 [id number]  Ortho Devices Type of Ortho Device: Thumb velcro splint Ortho Device/Splint Location: rle Ortho Device/Splint Interventions: Application   Walter FordyceJennifer C Antario Hansen 03/07/2017, 12:36 PM

## 2017-03-07 NOTE — Progress Notes (Signed)
Orthopaedic Trauma Service (OTS)  2 Days Post-Op Procedure(s) (LRB): IRRIGATION AND DEBRIDEMENT ANKLE AND LEG (Right) CLOSED REDUCTION HIP (Right)  Subjective: Patient reports pain as moderate.    Objective: Current Vitals Blood pressure (!) 155/84, pulse (!) 109, temperature 98.2 F (36.8 C), resp. rate (!) 24, height 5\' 9"  (1.753 m), weight 107.5 kg (237 lb), SpO2 94 %. Vital signs in last 24 hours: Temp:  [98.2 F (36.8 C)-99 F (37.2 C)] 98.2 F (36.8 C) (05/20 0810) Pulse Rate:  [75-118] 109 (05/20 0810) Resp:  [18-24] 24 (05/20 0810) BP: (137-155)/(76-84) 155/84 (05/20 0810) SpO2:  [90 %-98 %] 94 % (05/20 0810)  Intake/Output from previous day: 05/19 0701 - 05/20 0700 In: 2140 [P.O.:840; I.V.:1200; IV Piggyback:100] Out: 3200 [Urine:3200]  LABS  Recent Labs  03/05/17 1148 03/05/17 1201 03/06/17 0723 03/07/17 0400  HGB 16.6 16.3 12.0* 11.1*    Recent Labs  03/06/17 0723 03/07/17 0400  WBC 9.9 12.2*  RBC 3.90* 3.65*  HCT 35.7* 33.9*  PLT 170 154    Recent Labs  03/06/17 0600 03/07/17 0400  NA 138 133*  K 3.7 3.7  CL 105 101  CO2 23 23  BUN 7 5*  CREATININE 0.67 0.69  GLUCOSE 276* 205*  CALCIUM 7.9* 7.9*    Recent Labs  03/05/17 1148  INR 0.98     Physical Exam LUE  Splint not in place  Sens  Ax/R/M/U intact  Mot   Ax/ R/ PIN/ M/ AIN/ U intact  Brisk CR, Rad 2+ RLE  Dressing intact, clean, dry  Edema/ swelling controlled  Sens: DPN, SPN, TN intact  Motor: EHL, FHL, and lessor toe ext and flex all intact grossly  Brisk cap refill, warm to touch  Assessment/Plan: 2 Days Post-Op Procedure(s) (LRB): IRRIGATION AND DEBRIDEMENT ANKLE AND LEG (Right) CLOSED REDUCTION HIP (Right)   I have contacted hand Dr. Melvyn Novasrtmann re left scaphoid to make sure aware.   1. PT/OT NWB right lower and NWB LUEx  2. DVT proph Lovenox 3. ORIF tomorrow or Tues; will NPO p MN in case other case cancels  Walter GalasMichael Walter Bougher, MD [facility] [phone] (p)

## 2017-03-08 ENCOUNTER — Encounter (HOSPITAL_COMMUNITY): Payer: Self-pay | Admitting: Orthopedic Surgery

## 2017-03-08 ENCOUNTER — Inpatient Hospital Stay (HOSPITAL_COMMUNITY): Payer: BLUE CROSS/BLUE SHIELD

## 2017-03-08 DIAGNOSIS — R9431 Abnormal electrocardiogram [ECG] [EKG]: Secondary | ICD-10-CM

## 2017-03-08 DIAGNOSIS — R748 Abnormal levels of other serum enzymes: Secondary | ICD-10-CM

## 2017-03-08 LAB — URINALYSIS, ROUTINE W REFLEX MICROSCOPIC
Bacteria, UA: NONE SEEN
Bilirubin Urine: NEGATIVE
KETONES UR: 80 mg/dL — AB
Leukocytes, UA: NEGATIVE
NITRITE: NEGATIVE
PROTEIN: 30 mg/dL — AB
Specific Gravity, Urine: 1.031 — ABNORMAL HIGH (ref 1.005–1.030)
pH: 6 (ref 5.0–8.0)

## 2017-03-08 LAB — VITAMIN D 25 HYDROXY (VIT D DEFICIENCY, FRACTURES): VIT D 25 HYDROXY: 10.1 ng/mL — AB (ref 30.0–100.0)

## 2017-03-08 LAB — CBC
HCT: 33.4 % — ABNORMAL LOW (ref 39.0–52.0)
HEMOGLOBIN: 11.1 g/dL — AB (ref 13.0–17.0)
MCH: 30.5 pg (ref 26.0–34.0)
MCHC: 33.2 g/dL (ref 30.0–36.0)
MCV: 91.8 fL (ref 78.0–100.0)
Platelets: 160 10*3/uL (ref 150–400)
RBC: 3.64 MIL/uL — ABNORMAL LOW (ref 4.22–5.81)
RDW: 12.1 % (ref 11.5–15.5)
WBC: 12.1 10*3/uL — ABNORMAL HIGH (ref 4.0–10.5)

## 2017-03-08 LAB — ECHOCARDIOGRAM COMPLETE
Height: 69 in
Weight: 3792 oz

## 2017-03-08 LAB — GLUCOSE, CAPILLARY
Glucose-Capillary: 196 mg/dL — ABNORMAL HIGH (ref 65–99)
Glucose-Capillary: 203 mg/dL — ABNORMAL HIGH (ref 65–99)
Glucose-Capillary: 194 mg/dL — ABNORMAL HIGH (ref 65–99)
Glucose-Capillary: 176 mg/dL — ABNORMAL HIGH (ref 65–99)

## 2017-03-08 LAB — TROPONIN I: Troponin I: 0.05 ng/mL (ref <0.03)

## 2017-03-08 MED ORDER — PERFLUTREN LIPID MICROSPHERE
1.0000 mL | INTRAVENOUS | Status: AC | PRN
  Administered 2017-03-08: 3 mL via INTRAVENOUS
  Filled 2017-03-08: qty 10

## 2017-03-08 MED ORDER — BISACODYL 10 MG RE SUPP
10.0000 mg | Freq: Once | RECTAL | Status: AC
Start: 2017-03-08 — End: 2017-03-08
  Administered 2017-03-08: 10 mg via RECTAL
  Filled 2017-03-08: qty 1

## 2017-03-08 MED ORDER — ENOXAPARIN SODIUM 60 MG/0.6ML ~~LOC~~ SOLN
55.0000 mg | SUBCUTANEOUS | Status: DC
  Administered 2017-03-08: 55 mg via SUBCUTANEOUS
  Filled 2017-03-08: qty 0.6

## 2017-03-08 MED ORDER — PRO-STAT SUGAR FREE PO LIQD
30.0000 mL | Freq: Two times a day (BID) | ORAL | Status: DC
  Administered 2017-03-08: 30 mL via ORAL
  Filled 2017-03-08 (×4): qty 30

## 2017-03-08 MED ORDER — INSULIN STARTER KIT- SYRINGES (ENGLISH)
1.0000 | Freq: Once | Status: AC
  Administered 2017-03-08: 1
  Filled 2017-03-08: qty 1

## 2017-03-08 MED ORDER — GLUCERNA SHAKE PO LIQD
237.0000 mL | Freq: Three times a day (TID) | ORAL | Status: DC
  Administered 2017-03-08 – 2017-03-15 (×3): 237 mL via ORAL

## 2017-03-08 MED ORDER — CLINDAMYCIN PHOSPHATE 600 MG/50ML IV SOLN
600.0000 mg | INTRAVENOUS | Status: AC
  Administered 2017-03-09: 600 mg via INTRAVENOUS
  Filled 2017-03-08 (×2): qty 50

## 2017-03-08 MED ORDER — LIVING WELL WITH DIABETES BOOK
Freq: Once | Status: AC
  Administered 2017-03-08: 08:00:00
  Filled 2017-03-08: qty 1

## 2017-03-08 NOTE — Plan of Care (Signed)
Problem: Safety: Goal: Ability to remain free from injury will improve Outcome: Progressing No incidence of falls during this admission. Call bell within reach. Bed in low and locked position. Patient alert and oriented. Clean and clear environment maintained. Patient NWB to R leg. Patient verbalized understanding of safety instruction.  Problem: Pain Management: Goal: Pain level will decrease with appropriate interventions Outcome: Progressing Pain controlled with PRN pain medication at this time. Vital signs are stable. Patient resting well at this time.

## 2017-03-08 NOTE — Progress Notes (Signed)
RN spoke to patient and his step-mom at bedside about who patient's legal guardian was and if there was paperwork that could be brought to the hospital. Patient could not recall a name for his legal guardian but did acknowledge that he had one. Patient's step mom stated that Ladoris GeneMike Dennis was patient's uncle and his legal guardian. Patient's step mom provided nurse with Mike's number [phone]). RN called this number three times throughout the day and left a voicemail with no return call. RN spoke to patient's step mom again and she provided RN with Mike's wife's number - Amy [phone]). Amy stated "no, there is no official paperwork. We have just helped out since they were boys." RN confirmed twice that Amy's husband, [name], does not have legal guardianship over the patient and she stated "I've never heard of any paperwork being drawn up officially." She stated they had been "having issues with the phones" as to why [name] had "not called back." She stated that she would try to get in touch with him so that he could speak to RN.

## 2017-03-08 NOTE — Progress Notes (Addendum)
RN spoke to MeridianMike, patient's uncle, about legal guardianship. [name] stated "we never signed any official documents. We've just helped out." He referenced that the patient did "live on his own" and "signs his own papers." RN notified Montez MoritaKeith Paul, PA, of this and he stated he was going to consult psychiatry. RN had notified PA prior in the shift that patient was confused about requiring suppository. RN explained to patient that he was straining to have a bowel movement. RN explained the benefits of a suppository and how it would be administered. Patient did not seem to fully grasp information, stating "i don't know why I have to be put to sleep for that" or "i don't know why I need this" after RN explained reasoning behind suppository and clarified that patient would not have to be put to sleep for suppository administration. A second nurse was present during the suppository administration.
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
## 2017-03-08 NOTE — Progress Notes (Addendum)
Inpatient Diabetes Program Recommendations  AACE/ADA: New Consensus Statement on Inpatient Glycemic Control (2015)  Target Ranges:  Prepandial:   less than 140 mg/dL      Peak postprandial:   less than 180 mg/dL (1-2 hours)      Critically ill patients:  140 - 180 mg/dL   Lab Results  Component Value Date   GLUCAP 196 (H) 03/08/2017   HGBA1C 12.5 (H) 03/05/2017    Review of Glycemic Control Results for [place], [place] (MRN [id number]) as of 03/08/2017 08:07  Ref. Range 03/07/2017 05:53 03/07/2017 13:04 03/07/2017 16:37 03/07/2017 21:05 03/08/2017 05:50  Glucose-Capillary Latest Ref Range: 65 - 99 mg/dL 211 (H) 199 (H) 188 (H) 181 (H) 196 (H)   Diabetes history: No prior hx DM Current orders for Inpatient glycemic control: Novolog correction 0-15 units tid + 0-5 units hs  Inpatient Diabetes Program Recommendations:  Noted patient is new onset. RNs to provide ongoing basic DM education at bedside with this patient. Have ordered educational booklet, insulin starter kit, and DM videos. Have also placed RD consult for DM diet education for this patient along with case manager consult due to no insurance.  Based on insulin requirements over the past 24 hrs (11 units), Please consider: -Lantus 10 units daily -Change Novolog correction to q 4 hrs tonight prior to surgery tomorrow. Will follow.  1:15 pm Spoke with pt @ bedside about new diagnosis. Discussed A1C 12.5 (average CBG 312 over the past 2-3 months)  and explained what an A1C is, basic pathophysiology of DM Type 2, basic home care, basic diabetes diet nutrition principles, importance of checking CBGs and maintaining good CBG control to prevent long-term and short-term complications. Reviewed signs and symptoms of hyperglycemia and hypoglycemia and how to treat hypoglycemia at home. Also reviewed blood sugar goals at home.   Thank you, Judy E. Hanks, RN, MSN, CDE  Diabetes Coordinator Inpatient Glycemic Control Team Team Pager  #[phone] (8am-5pm) 03/08/2017 8:16 AM       

## 2017-03-08 NOTE — Progress Notes (Signed)
  Echocardiogram 2D Echocardiogram has been performed.  Leta JunglingCooper, Shequita Peplinski M 03/08/2017, 10:30 AM
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
## 2017-03-09 ENCOUNTER — Inpatient Hospital Stay (HOSPITAL_COMMUNITY): Payer: BLUE CROSS/BLUE SHIELD

## 2017-03-09 ENCOUNTER — Inpatient Hospital Stay (HOSPITAL_COMMUNITY): Payer: BLUE CROSS/BLUE SHIELD | Admitting: Certified Registered Nurse Anesthetist

## 2017-03-09 ENCOUNTER — Encounter (HOSPITAL_COMMUNITY): Admission: EM | Disposition: A | Discharge status: Skilled Nursing Facility | Payer: Self-pay | Source: Home / Self Care

## 2017-03-09 HISTORY — PX: ORIF PATELLA: SHX5033

## 2017-03-09 HISTORY — PX: ORIF ANKLE FRACTURE: SHX5408

## 2017-03-09 HISTORY — PX: ORIF PELVIC FRACTURE: SHX2128

## 2017-03-09 LAB — LIPID PANEL
CHOL/HDL RATIO: 6.3 ratio
Cholesterol: 190 mg/dL (ref 0–200)
HDL: 30 mg/dL — AB (ref >40)
LDL CALC: 112 mg/dL — AB (ref 0–99)
TRIGLYCERIDES: 241 mg/dL — AB (ref <150)
VLDL: 48 mg/dL — ABNORMAL HIGH (ref 0–40)

## 2017-03-09 LAB — CBC
HCT: 34.5 % — ABNORMAL LOW (ref 39.0–52.0)
HEMOGLOBIN: 11.4 g/dL — AB (ref 13.0–17.0)
MCH: 30.7 pg (ref 26.0–34.0)
MCHC: 33 g/dL (ref 30.0–36.0)
MCV: 93 fL (ref 78.0–100.0)
Platelets: 214 10*3/uL (ref 150–400)
RBC: 3.71 MIL/uL — ABNORMAL LOW (ref 4.22–5.81)
RDW: 12.4 % (ref 11.5–15.5)
WBC: 11.4 10*3/uL — ABNORMAL HIGH (ref 4.0–10.5)

## 2017-03-09 LAB — COMPREHENSIVE METABOLIC PANEL
ALBUMIN: 2.4 g/dL — AB (ref 3.5–5.0)
ALT: 55 U/L (ref 17–63)
ANION GAP: 9 (ref 5–15)
AST: 25 U/L (ref 15–41)
Alkaline Phosphatase: 64 U/L (ref 38–126)
BILIRUBIN TOTAL: 1.2 mg/dL (ref 0.3–1.2)
BUN: 10 mg/dL (ref 6–20)
CHLORIDE: 102 mmol/L (ref 101–111)
CO2: 23 mmol/L (ref 22–32)
Calcium: 8.1 mg/dL — ABNORMAL LOW (ref 8.9–10.3)
Creatinine, Ser: 0.66 mg/dL (ref 0.61–1.24)
GFR calc Af Amer: >60 mL/min (ref >60)
GFR calc non Af Amer: >60 mL/min (ref >60)
GLUCOSE: 203 mg/dL — AB (ref 65–99)
Potassium: 3.9 mmol/L (ref 3.5–5.1)
SODIUM: 134 mmol/L — AB (ref 135–145)
Total Protein: 6 g/dL — ABNORMAL LOW (ref 6.5–8.1)

## 2017-03-09 LAB — GLUCOSE, CAPILLARY
Glucose-Capillary: 165 mg/dL — ABNORMAL HIGH (ref 65–99)
Glucose-Capillary: 188 mg/dL — ABNORMAL HIGH (ref 65–99)
Glucose-Capillary: 190 mg/dL — ABNORMAL HIGH (ref 65–99)

## 2017-03-09 SURGERY — OPEN REDUCTION INTERNAL FIXATION (ORIF) PELVIC FRACTURE
Anesthesia: General | Laterality: Right

## 2017-03-09 MED ORDER — MIDAZOLAM HCL 2 MG/2ML IJ SOLN
INTRAMUSCULAR | Status: AC
  Filled 2017-03-09: qty 2

## 2017-03-09 MED ORDER — FENTANYL CITRATE (PF) 100 MCG/2ML IJ SOLN
INTRAMUSCULAR | Status: DC | PRN
  Administered 2017-03-09: 100 ug via INTRAVENOUS
  Administered 2017-03-09 (×3): 50 ug via INTRAVENOUS
  Administered 2017-03-09: 100 ug via INTRAVENOUS
  Administered 2017-03-09 (×8): 50 ug via INTRAVENOUS

## 2017-03-09 MED ORDER — GLYCOPYRROLATE 0.2 MG/ML IJ SOLN
INTRAMUSCULAR | Status: DC | PRN
  Administered 2017-03-09: 0.4 mg via INTRAVENOUS

## 2017-03-09 MED ORDER — VECURONIUM BROMIDE 10 MG IV SOLR
INTRAVENOUS | Status: AC
  Filled 2017-03-09: qty 10

## 2017-03-09 MED ORDER — LACTATED RINGERS IV SOLN
INTRAVENOUS | Status: DC | PRN
  Administered 2017-03-09 (×2): via INTRAVENOUS

## 2017-03-09 MED ORDER — ROCURONIUM BROMIDE 10 MG/ML (PF) SYRINGE
PREFILLED_SYRINGE | INTRAVENOUS | Status: AC
  Filled 2017-03-09: qty 5

## 2017-03-09 MED ORDER — LABETALOL HCL 5 MG/ML IV SOLN: 5.0000 mg | INTRAVENOUS | Status: DC | PRN

## 2017-03-09 MED ORDER — ACETAMINOPHEN 325 MG PO TABS: 650.0000 mg | ORAL_TABLET | Freq: Four times a day (QID) | ORAL | Status: DC | PRN

## 2017-03-09 MED ORDER — METHOCARBAMOL 1000 MG/10ML IJ SOLN: 1000.0000 mg | Freq: Four times a day (QID) | INTRAMUSCULAR | Status: DC

## 2017-03-09 MED ORDER — HYDROMORPHONE HCL 1 MG/ML IJ SOLN: 0.2500 mg | INTRAMUSCULAR | Status: DC | PRN

## 2017-03-09 MED ORDER — OXYCODONE HCL 5 MG PO TABS: 5.0000 mg | ORAL_TABLET | Freq: Once | ORAL | Status: DC | PRN

## 2017-03-09 MED ORDER — METOCLOPRAMIDE HCL 5 MG PO TABS: 5.0000 mg | ORAL_TABLET | Freq: Three times a day (TID) | ORAL | Status: DC | PRN

## 2017-03-09 MED ORDER — OXYCODONE-ACETAMINOPHEN 5-325 MG PO TABS
1.0000 | ORAL_TABLET | Freq: Four times a day (QID) | ORAL | Status: DC | PRN
  Administered 2017-03-13 – 2017-03-16 (×7): 2 via ORAL
  Filled 2017-03-09 (×7): qty 2

## 2017-03-09 MED ORDER — NEOSTIGMINE METHYLSULFATE 5 MG/5ML IV SOSY
PREFILLED_SYRINGE | INTRAVENOUS | Status: AC
  Filled 2017-03-09: qty 5

## 2017-03-09 MED ORDER — LACTATED RINGERS IV SOLN
INTRAVENOUS | Status: DC | PRN
  Administered 2017-03-09: 15:00:00 via INTRAVENOUS

## 2017-03-09 MED ORDER — LABETALOL HCL 5 MG/ML IV SOLN
INTRAVENOUS | Status: AC
  Filled 2017-03-09: qty 4

## 2017-03-09 MED ORDER — NEOSTIGMINE METHYLSULFATE 10 MG/10ML IV SOLN
INTRAVENOUS | Status: DC | PRN
  Administered 2017-03-09: 3 mg via INTRAVENOUS

## 2017-03-09 MED ORDER — OXYCODONE HCL 5 MG PO TABS
5.0000 mg | ORAL_TABLET | ORAL | Status: DC | PRN
  Administered 2017-03-10 – 2017-03-11 (×2): 10 mg via ORAL
  Filled 2017-03-09 (×2): qty 2

## 2017-03-09 MED ORDER — FENTANYL CITRATE (PF) 250 MCG/5ML IJ SOLN
INTRAMUSCULAR | Status: AC
  Filled 2017-03-09: qty 5

## 2017-03-09 MED ORDER — ACETAMINOPHEN 650 MG RE SUPP: 650.0000 mg | Freq: Four times a day (QID) | RECTAL | Status: DC | PRN

## 2017-03-09 MED ORDER — LACTATED RINGERS IV SOLN
INTRAVENOUS | Status: DC
  Administered 2017-03-09: 13:00:00 via INTRAVENOUS

## 2017-03-09 MED ORDER — PROMETHAZINE HCL 25 MG/ML IJ SOLN: 6.2500 mg | INTRAMUSCULAR | Status: DC | PRN

## 2017-03-09 MED ORDER — MIDAZOLAM HCL 5 MG/5ML IJ SOLN
INTRAMUSCULAR | Status: DC | PRN
  Administered 2017-03-09: 2 mg via INTRAVENOUS

## 2017-03-09 MED ORDER — 0.9 % SODIUM CHLORIDE (POUR BTL) OPTIME
TOPICAL | Status: DC | PRN
  Administered 2017-03-09: 1000 mL

## 2017-03-09 MED ORDER — METHOCARBAMOL 500 MG PO TABS
1000.0000 mg | ORAL_TABLET | Freq: Four times a day (QID) | ORAL | Status: DC
  Administered 2017-03-09 – 2017-03-16 (×19): 1000 mg via ORAL
  Filled 2017-03-09 (×21): qty 2

## 2017-03-09 MED ORDER — CLINDAMYCIN PHOSPHATE 600 MG/50ML IV SOLN
600.0000 mg | Freq: Four times a day (QID) | INTRAVENOUS | Status: AC
  Administered 2017-03-10 (×3): 600 mg via INTRAVENOUS
  Filled 2017-03-09 (×3): qty 50

## 2017-03-09 MED ORDER — ROCURONIUM BROMIDE 100 MG/10ML IV SOLN
INTRAVENOUS | Status: DC | PRN
  Administered 2017-03-09 (×4): 50 mg via INTRAVENOUS

## 2017-03-09 MED ORDER — ONDANSETRON HCL 4 MG/2ML IJ SOLN
INTRAMUSCULAR | Status: DC | PRN
  Administered 2017-03-09: 4 mg via INTRAVENOUS

## 2017-03-09 MED ORDER — SODIUM CHLORIDE 0.9 % IJ SOLN
INTRAMUSCULAR | Status: AC
  Filled 2017-03-09: qty 10

## 2017-03-09 MED ORDER — METOCLOPRAMIDE HCL 5 MG/ML IJ SOLN: 5.0000 mg | Freq: Three times a day (TID) | INTRAMUSCULAR | Status: DC | PRN

## 2017-03-09 MED ORDER — PROPOFOL 10 MG/ML IV BOLUS
INTRAVENOUS | Status: DC | PRN
  Administered 2017-03-09: 20 mg via INTRAVENOUS
  Administered 2017-03-09: 150 mg via INTRAVENOUS

## 2017-03-09 MED ORDER — LIDOCAINE HCL (CARDIAC) 20 MG/ML IV SOLN
INTRAVENOUS | Status: DC | PRN
  Administered 2017-03-09: 100 mg via INTRAVENOUS

## 2017-03-09 MED ORDER — OXYCODONE HCL 5 MG/5ML PO SOLN
5.0000 mg | Freq: Once | ORAL | Status: DC | PRN
Start: 2017-03-09 — End: 2017-03-09

## 2017-03-09 MED ORDER — ONDANSETRON HCL 4 MG PO TABS
4.0000 mg | ORAL_TABLET | Freq: Four times a day (QID) | ORAL | Status: DC | PRN
Start: 2017-03-09 — End: 2017-03-16

## 2017-03-09 MED ORDER — ONDANSETRON HCL 4 MG/2ML IJ SOLN: 4.0000 mg | Freq: Four times a day (QID) | INTRAMUSCULAR | Status: DC | PRN

## 2017-03-09 MED ORDER — VECURONIUM BROMIDE 10 MG IV SOLR
INTRAVENOUS | Status: DC | PRN
  Administered 2017-03-09: 1 mg via INTRAVENOUS
  Administered 2017-03-09: 2 mg via INTRAVENOUS
  Administered 2017-03-09: 1 mg via INTRAVENOUS

## 2017-03-09 MED ORDER — ONDANSETRON HCL 4 MG/2ML IJ SOLN
INTRAMUSCULAR | Status: AC
  Filled 2017-03-09: qty 2

## 2017-03-09 MED ORDER — ENOXAPARIN SODIUM 60 MG/0.6ML ~~LOC~~ SOLN
55.0000 mg | SUBCUTANEOUS | Status: DC
  Administered 2017-03-10 – 2017-03-16 (×7): 55 mg via SUBCUTANEOUS
  Filled 2017-03-09 (×7): qty 0.6

## 2017-03-09 SURGICAL SUPPLY — 93 items
BANDAGE ACE 4X5 VEL STRL LF (GAUZE/BANDAGES/DRESSINGS) ×2 IMPLANT
BANDAGE ACE 6X5 VEL STRL LF (GAUZE/BANDAGES/DRESSINGS) ×4 IMPLANT
BANDAGE ESMARK 6X9 LF (GAUZE/BANDAGES/DRESSINGS) ×1 IMPLANT
BIT DRILL 2.5 CANN STRL (BIT) ×2 IMPLANT
BIT DRILL 2.6 CANN (BIT) ×2 IMPLANT
BLADE CLIPPER SURG (BLADE) IMPLANT
BNDG CMPR 9X6 STRL LF SNTH (GAUZE/BANDAGES/DRESSINGS) ×1
BNDG ESMARK 6X9 LF (GAUZE/BANDAGES/DRESSINGS) ×3
BNDG GAUZE ELAST 4 BULKY (GAUZE/BANDAGES/DRESSINGS) ×6 IMPLANT
BRUSH SCRUB SURG 4.25 DISP (MISCELLANEOUS) ×6 IMPLANT
COVER MAYO STAND STRL (DRAPES) ×3 IMPLANT
COVER SURGICAL LIGHT HANDLE (MISCELLANEOUS) ×3 IMPLANT
DRAIN CHANNEL 15F RND FF W/TCR (WOUND CARE) IMPLANT
DRAPE C-ARM 42X72 X-RAY (DRAPES) ×3 IMPLANT
DRAPE C-ARMOR (DRAPES) ×4 IMPLANT
DRAPE HALF SHEET 40X57 (DRAPES) ×3 IMPLANT
DRAPE INCISE IOBAN 66X45 STRL (DRAPES) ×2 IMPLANT
DRAPE INCISE IOBAN 85X60 (DRAPES) ×2 IMPLANT
DRAPE LAPAROTOMY TRNSV 102X78 (DRAPE) ×3 IMPLANT
DRAPE SURG 17X23 STRL (DRAPES) ×3 IMPLANT
DRAPE U-SHAPE 47X51 STRL (DRAPES) ×3 IMPLANT
DRSG EMULSION OIL 3X3 NADH (GAUZE/BANDAGES/DRESSINGS) IMPLANT
DRSG MEPILEX BORDER 4X8 (GAUZE/BANDAGES/DRESSINGS) ×2 IMPLANT
DRSG MEPITEL 4X7.2 (GAUZE/BANDAGES/DRESSINGS) ×4 IMPLANT
Doublewire #6 (Wire) ×2 IMPLANT
ELECT BLADE 4.0 EZ CLEAN MEGAD (MISCELLANEOUS) ×3
ELECT REM PT RETURN 9FT ADLT (ELECTROSURGICAL) ×3
ELECTRODE BLDE 4.0 EZ CLN MEGD (MISCELLANEOUS) IMPLANT
ELECTRODE REM PT RTRN 9FT ADLT (ELECTROSURGICAL) ×1 IMPLANT
EVACUATOR SILICONE 100CC (DRAIN) IMPLANT
GAUZE SPONGE 4X4 12PLY STRL (GAUZE/BANDAGES/DRESSINGS) IMPLANT
GAUZE SPONGE 4X4 12PLY STRL LF (GAUZE/BANDAGES/DRESSINGS) ×4 IMPLANT
GLOVE BIO SURGEON STRL SZ7.5 (GLOVE) ×3 IMPLANT
GLOVE BIO SURGEON STRL SZ8 (GLOVE) ×3 IMPLANT
GLOVE BIOGEL PI IND STRL 7.5 (GLOVE) ×1 IMPLANT
GLOVE BIOGEL PI IND STRL 8 (GLOVE) ×1 IMPLANT
GLOVE BIOGEL PI INDICATOR 7.5 (GLOVE) ×2
GLOVE BIOGEL PI INDICATOR 8 (GLOVE) ×2
GOWN STRL REUS W/ TWL LRG LVL3 (GOWN DISPOSABLE) ×2 IMPLANT
GOWN STRL REUS W/ TWL XL LVL3 (GOWN DISPOSABLE) ×1 IMPLANT
GOWN STRL REUS W/TWL LRG LVL3 (GOWN DISPOSABLE) ×6
GOWN STRL REUS W/TWL XL LVL3 (GOWN DISPOSABLE) ×3
GUIDEWIRE 1.35MM (WIRE) ×2 IMPLANT
K-WIRE SS DISP .054X1.14 (WIRE) ×2 IMPLANT
KIT BASIN OR (CUSTOM PROCEDURE TRAY) ×3 IMPLANT
KIT ROOM TURNOVER OR (KITS) ×3 IMPLANT
MANIFOLD NEPTUNE II (INSTRUMENTS) ×1 IMPLANT
NDL HYPO 21X1.5 SAFETY (NEEDLE) IMPLANT
NEEDLE HYPO 21X1.5 SAFETY (NEEDLE) IMPLANT
NS IRRIG 1000ML POUR BTL (IV SOLUTION) ×4 IMPLANT
PACK GENERAL/GYN (CUSTOM PROCEDURE TRAY) ×3 IMPLANT
PACK ORTHO EXTREMITY (CUSTOM PROCEDURE TRAY) ×3 IMPLANT
PACK TOTAL JOINT (CUSTOM PROCEDURE TRAY) ×3 IMPLANT
PAD ABD 8X10 STRL (GAUZE/BANDAGES/DRESSINGS) ×4 IMPLANT
PAD ARMBOARD 7.5X6 YLW CONV (MISCELLANEOUS) ×6 IMPLANT
PAD CAST 4YDX4 CTTN HI CHSV (CAST SUPPLIES) IMPLANT
PADDING CAST ABS 4INX4YD NS (CAST SUPPLIES) ×4
PADDING CAST ABS 6INX4YD NS (CAST SUPPLIES) ×4
PADDING CAST ABS COTTON 4X4 ST (CAST SUPPLIES) IMPLANT
PADDING CAST ABS COTTON 6X4 NS (CAST SUPPLIES) IMPLANT
PADDING CAST COTTON 4X4 STRL (CAST SUPPLIES)
PADDING CAST COTTON 6X4 STRL (CAST SUPPLIES) IMPLANT
PLATE LOCK MED HOOK 3H (Plate) ×2 IMPLANT
RETRIEVER SUT HEWSON (MISCELLANEOUS) ×2 IMPLANT
SCISSORS WIRE DISP (INSTRUMENTS) ×2 IMPLANT
SCREW BONE 2.7X54MM CORTICAL (Screw) ×1 IMPLANT
SCREW BONE 3.5X42MM CORTICAL (Screw) ×1 IMPLANT
SCREW CORTICAL 3.5X78MM LP TM (Screw) ×2 IMPLANT
SPONGE LAP 18X18 X RAY DECT (DISPOSABLE) ×3 IMPLANT
STAPLER VISISTAT 35W (STAPLE) ×3 IMPLANT
SUCTION FRAZIER HANDLE 10FR (MISCELLANEOUS) ×2
SUCTION TUBE FRAZIER 10FR DISP (MISCELLANEOUS) ×1 IMPLANT
SUT ETHILON 3 0 PS 1 (SUTURE) ×6 IMPLANT
SUT FIBERWIRE #2 38 T-5 BLUE (SUTURE) ×6
SUT PDS AB 2-0 CT1 27 (SUTURE) IMPLANT
SUT STEEL 6MS V (SUTURE) ×4 IMPLANT
SUT VIC AB 0 CT1 27 (SUTURE) ×9
SUT VIC AB 0 CT1 27XBRD ANBCTR (SUTURE) ×2 IMPLANT
SUT VIC AB 1 CT1 18XCR BRD 8 (SUTURE) ×2 IMPLANT
SUT VIC AB 1 CT1 8-18 (SUTURE) ×6
SUT VIC AB 2-0 CT1 27 (SUTURE) ×6
SUT VIC AB 2-0 CT1 TAPERPNT 27 (SUTURE) ×2 IMPLANT
SUTURE FIBERWR #2 38 T-5 BLUE (SUTURE) IMPLANT
Surgical Steel Monofilament (Wire) ×2 IMPLANT
TOWEL OR 17X24 6PK STRL BLUE (TOWEL DISPOSABLE) ×3 IMPLANT
TOWEL OR 17X26 10 PK STRL BLUE (TOWEL DISPOSABLE) ×8 IMPLANT
TRAY FOLEY W/METER SILVER 16FR (SET/KITS/TRAYS/PACK) IMPLANT
TUBE CONNECTING 12'X1/4 (SUCTIONS) ×1
TUBE CONNECTING 12X1/4 (SUCTIONS) ×2 IMPLANT
UNDERPAD 30X30 (UNDERPADS AND DIAPERS) ×3 IMPLANT
WATER STERILE IRR 1000ML POUR (IV SOLUTION) ×4 IMPLANT
YANKAUER SUCT BULB TIP NO VENT (SUCTIONS) ×4 IMPLANT
kwire  1.35mm ×2 IMPLANT
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
## 2017-03-09 NOTE — Transfer of Care (Signed)
Immediate Anesthesia Transfer of Care Note  Patient: Walter HaradaWilliam Hansen  Procedure(s) Performed: Procedure(s): OPEN TREATMENT HIP DISLOCATION (Right) OPEN REDUCTION INTERNAL FIXATION (ORIF) ANKLE FRACTURE (Right) OPEN REDUCTION INTERNAL (ORIF) FIXATION PATELLA (Right)  Patient Location: PACU  Anesthesia Type:General  Level of Consciousness: awake  Airway & Oxygen Therapy: Patient Spontanous Breathing and Patient connected to face mask oxygen  Post-op Assessment: Report given to RN and Post -op Vital signs reviewed and stable  Post vital signs: Reviewed and stable  Last Vitals:  Vitals:   03/09/17 0430 03/09/17 2027  BP: (!) 160/95   Pulse: 96   Resp: 18   Temp: 37 C (P) 36.3 C    Last Pain:  Vitals:   03/09/17 0941  TempSrc:   PainSc: 0-No pain      Patients Stated Pain Goal: 3 (03/08/17 2308)  Complications: No apparent anesthesia complications

## 2017-03-09 NOTE — Anesthesia Postprocedure Evaluation (Addendum)
Anesthesia Post Note  Patient: Anthoney HaradaWilliam Padin  Procedure(s) Performed: Procedure(s) (LRB): OPEN TREATMENT HIP DISLOCATION (Right) OPEN REDUCTION INTERNAL FIXATION (ORIF) ANKLE FRACTURE (Right) OPEN REDUCTION INTERNAL (ORIF) FIXATION PATELLA (Right)  Patient location during evaluation: PACU Anesthesia Type: General Level of consciousness: awake, awake and alert and oriented Pain management: pain level controlled Vital Signs Assessment: post-procedure vital signs reviewed and stable Respiratory status: spontaneous breathing, nonlabored ventilation and respiratory function stable Cardiovascular status: blood pressure returned to baseline Anesthetic complications: no       Last Vitals:  Vitals:   03/09/17 2029 03/09/17 2039  BP: (!) 195/116 (!) 183/104  Pulse: (!) 126 (!) 113  Resp: (!) 26 (!) 23  Temp:      Last Pain:  Vitals:   03/09/17 2027  TempSrc:   PainSc: Asleep                 Audrianna Driskill COKER

## 2017-03-09 NOTE — Anesthesia Procedure Notes (Signed)
Procedure Name: Intubation Date/Time: 03/09/2017 3:24 PM Performed by: Purvis Kilts Pre-anesthesia Checklist: Patient identified, Emergency Drugs available, Suction available, Patient being monitored and Timeout performed Patient Re-evaluated:Patient Re-evaluated prior to inductionOxygen Delivery Method: Circle system utilized Preoxygenation: Pre-oxygenation with 100% oxygen Intubation Type: IV induction Ventilation: Mask ventilation without difficulty Laryngoscope Size: Mac and 4 Grade View: Grade II Tube type: Oral Tube size: 7.0 mm Number of attempts: 1 Placement Confirmation: ETT inserted through vocal cords under direct vision,  positive ETCO2 and breath sounds checked- equal and bilateral Secured at: 22 cm Tube secured with: Tape Dental Injury: Teeth and Oropharynx as per pre-operative assessment
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
## 2017-03-09 NOTE — Consult Note (Signed)
Unable to assess patient given that he went to OR and is now in post-op.  Consult was for assessment of the patient's capacity to make decisions, but it appears that providers have coordinated with family to make medical decisions.  Please re-consult psychiatry if need arises.  Will sign off unless otherwise re-consulted.

## 2017-03-09 NOTE — Progress Notes (Signed)
PT Cancellation Note  Patient Details Name: Walter HaradaWilliam Hansen MRN: [id number] DOB: 06/28/1981   Cancelled Treatment:    Reason Eval/Treat Not Completed: Patient at procedure or test/unavailable Pt to OR today. Will follow up as pt available.   Margot ChimesBrittany Smith, PT, DPT  Acute Rehabilitation Services  Pager: [phone]    Melvyn NovasBrittany L Smith 03/09/2017, 12:48 PM

## 2017-03-09 NOTE — Progress Notes (Signed)
Off unit to OR. Left unit via bed, 02 4 liters Sarah Ann intact. Family accompaning to OR.;

## 2017-03-09 NOTE — Progress Notes (Signed)
I discussed with the patient, his stepmother, and his uncle the risks and benefits of surgery for the right hip joint, knee cap, and ankle, including the possibility of infection, nerve injury, vessel injury, wound breakdown, arthritis, avascular necrosis, symptomatic hardware, DVT/ PE, loss of motion, instability and need for further surgery including total hip arthroplasty among others.  The patient and his family members acknowledged these risks and wished to proceed.  Myrene GalasMichael Abi Shoults, MD [facility] [phone] (p)

## 2017-03-09 NOTE — Anesthesia Preprocedure Evaluation (Addendum)
Anesthesia Evaluation  Patient identified by MRN, date of birth, ID band Patient awake    Reviewed: Allergy & Precautions, NPO status , Patient's Chart, lab work & pertinent test results  Airway Mallampati: III  TM Distance: >3 FB Neck ROM: Full    Dental no notable dental hx. (+) Missing,    Pulmonary neg pulmonary ROS,    Pulmonary exam normal breath sounds clear to auscultation       Cardiovascular Exercise Tolerance: Good negative cardio ROS Normal cardiovascular exam Rhythm:Regular Rate:Normal  ECG: NSR, rate 96 ECHO: - Left ventricle: The cavity size was normal. There was mild   concentric hypertrophy. Systolic function was normal. The   estimated ejection fraction was in the range of 55% to 60%. Wall   motion was normal; there were no regional wall motion   abnormalities. Left ventricular diastolic function parameters   were normal. - Left atrium: The atrium was mildly dilated.    Neuro/Psych Developmental delay negative psych ROS   GI/Hepatic negative GI ROS, Neg liver ROS,   Endo/Other  diabetes, Poorly Controlled, Insulin Dependent  Renal/GU negative Renal ROS  negative genitourinary   Musculoskeletal negative musculoskeletal ROS (+)   Abdominal   Peds negative pediatric ROS (+)  Hematology  (+) anemia ,   Anesthesia Other Findings Obese, BMI: 35  Reproductive/Obstetrics negative OB ROS                          Anesthesia Physical Anesthesia Plan  ASA: III  Anesthesia Plan: General   Post-op Pain Management:    Induction: Intravenous  Airway Management Planned: Oral ETT  Additional Equipment:   Intra-op Plan:   Post-operative Plan: Extubation in OR  Informed Consent: I have reviewed the patients History and Physical, chart, labs and discussed the procedure including the risks, benefits and alternatives for the proposed anesthesia with the patient or authorized  representative who has indicated his/her understanding and acceptance.   Dental advisory given  Plan Discussed with: CRNA and Surgeon  Anesthesia Plan Comments:        Anesthesia Quick Evaluation

## 2017-03-10 ENCOUNTER — Encounter (HOSPITAL_COMMUNITY): Payer: Self-pay | Admitting: Orthopedic Surgery

## 2017-03-10 DIAGNOSIS — E559 Vitamin D deficiency, unspecified: Secondary | ICD-10-CM | POA: Diagnosis present

## 2017-03-10 DIAGNOSIS — Z419 Encounter for procedure for purposes other than remedying health state, unspecified: Secondary | ICD-10-CM

## 2017-03-10 DIAGNOSIS — E1365 Other specified diabetes mellitus with hyperglycemia: Secondary | ICD-10-CM

## 2017-03-10 DIAGNOSIS — T1490XA Injury, unspecified, initial encounter: Secondary | ICD-10-CM

## 2017-03-10 HISTORY — DX: Vitamin D deficiency, unspecified: E55.9

## 2017-03-10 LAB — BASIC METABOLIC PANEL
ANION GAP: 9 (ref 5–15)
BUN: 10 mg/dL (ref 6–20)
CALCIUM: 8.1 mg/dL — AB (ref 8.9–10.3)
CO2: 24 mmol/L (ref 22–32)
Chloride: 99 mmol/L — ABNORMAL LOW (ref 101–111)
Creatinine, Ser: 0.67 mg/dL (ref 0.61–1.24)
GFR calc Af Amer: >60 mL/min (ref >60)
GLUCOSE: 253 mg/dL — AB (ref 65–99)
Potassium: 4.6 mmol/L (ref 3.5–5.1)
Sodium: 132 mmol/L — ABNORMAL LOW (ref 135–145)

## 2017-03-10 LAB — CBC
HEMATOCRIT: 33.6 % — AB (ref 39.0–52.0)
Hemoglobin: 10.9 g/dL — ABNORMAL LOW (ref 13.0–17.0)
MCH: 30.3 pg (ref 26.0–34.0)
MCHC: 32.4 g/dL (ref 30.0–36.0)
MCV: 93.3 fL (ref 78.0–100.0)
PLATELETS: 251 10*3/uL (ref 150–400)
RBC: 3.6 MIL/uL — ABNORMAL LOW (ref 4.22–5.81)
RDW: 12.3 % (ref 11.5–15.5)
WBC: 13.2 10*3/uL — AB (ref 4.0–10.5)

## 2017-03-10 LAB — GLUCOSE, CAPILLARY
GLUCOSE-CAPILLARY: 259 mg/dL — AB (ref 65–99)
GLUCOSE-CAPILLARY: 202 mg/dL — AB (ref 65–99)
Glucose-Capillary: 212 mg/dL — ABNORMAL HIGH (ref 65–99)
Glucose-Capillary: 229 mg/dL — ABNORMAL HIGH (ref 65–99)
Glucose-Capillary: 259 mg/dL — ABNORMAL HIGH (ref 65–99)

## 2017-03-10 MED ORDER — VITAMIN D 1000 UNITS PO TABS
2000.0000 [IU] | ORAL_TABLET | Freq: Two times a day (BID) | ORAL | Status: DC
  Administered 2017-03-10 – 2017-03-16 (×13): 2000 [IU] via ORAL
  Filled 2017-03-10 (×13): qty 2

## 2017-03-10 MED ORDER — INSULIN DETEMIR 100 UNIT/ML ~~LOC~~ SOLN
5.0000 [IU] | Freq: Every day | SUBCUTANEOUS | Status: DC
Start: 2017-03-10 — End: 2017-03-11
  Administered 2017-03-10 – 2017-03-11 (×2): 5 [IU] via SUBCUTANEOUS
  Filled 2017-03-10 (×2): qty 0.05

## 2017-03-10 MED ORDER — VITAMIN C 500 MG PO TABS
1000.0000 mg | ORAL_TABLET | Freq: Every day | ORAL | Status: DC
  Administered 2017-03-10 – 2017-03-16 (×7): 1000 mg via ORAL
  Filled 2017-03-10 (×7): qty 2
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
## 2017-03-10 NOTE — Progress Notes (Addendum)
Inpatient Diabetes Program Recommendations  AACE/ADA: New Consensus Statement on Inpatient Glycemic Control (2015)  Target Ranges:  Prepandial:   less than 140 mg/dL      Peak postprandial:   less than 180 mg/dL (1-2 hours)      Critically ill patients:  140 - 180 mg/dL   Lab Results  Component Value Date   GLUCAP 259 (H) 03/10/2017   HGBA1C 12.5 (H) 03/05/2017    Review of Glycemic Control Results for Walter Hansen, Racer (MRN [id number]) as of 03/10/2017 11:38  Ref. Range 03/09/2017 06:52 03/09/2017 11:44 03/09/2017 13:43 03/10/2017 00:49 03/10/2017 07:04  Glucose-Capillary Latest Ref Range: 65 - 99 mg/dL 562165 (H) 130188 (H) 865190 (H) 259 (H) 259 (H)   Diabetes history: New onset Current orders for Inpatient glycemic control: Novolog correction 0-15 units tid + 0-5 units hs  Inpatient Diabetes Program Recommendations:    Fasting CBG 259  Please consider oral agents-Metformin and Glipizide. Will follow.  Thank you, Billy FischerJudy E. Evelyn Aguinaldo, RN, MSN, CDE  Diabetes Coordinator Inpatient Glycemic Control Team Team Pager [phone] (8am-5pm) 03/10/2017 11:50 AM
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
## 2017-03-11 DIAGNOSIS — S73004A Unspecified dislocation of right hip, initial encounter: Principal | ICD-10-CM

## 2017-03-11 DIAGNOSIS — E139 Other specified diabetes mellitus without complications: Secondary | ICD-10-CM

## 2017-03-11 DIAGNOSIS — T07XXXA Unspecified multiple injuries, initial encounter: Secondary | ICD-10-CM

## 2017-03-11 LAB — BASIC METABOLIC PANEL
ANION GAP: 8 (ref 5–15)
BUN: 10 mg/dL (ref 6–20)
CHLORIDE: 98 mmol/L — AB (ref 101–111)
CO2: 26 mmol/L (ref 22–32)
Calcium: 8.1 mg/dL — ABNORMAL LOW (ref 8.9–10.3)
Creatinine, Ser: 0.66 mg/dL (ref 0.61–1.24)
GFR calc non Af Amer: >60 mL/min (ref >60)
Glucose, Bld: 236 mg/dL — ABNORMAL HIGH (ref 65–99)
POTASSIUM: 3.9 mmol/L (ref 3.5–5.1)
SODIUM: 132 mmol/L — AB (ref 135–145)

## 2017-03-11 LAB — URINALYSIS, ROUTINE W REFLEX MICROSCOPIC
BACTERIA UA: NONE SEEN
BILIRUBIN URINE: NEGATIVE
Hgb urine dipstick: NEGATIVE
KETONES UR: 20 mg/dL — AB
LEUKOCYTES UA: NEGATIVE
NITRITE: NEGATIVE
PH: 6 (ref 5.0–8.0)
Protein, ur: 30 mg/dL — AB
SPECIFIC GRAVITY, URINE: 1.02 (ref 1.005–1.030)
Squamous Epithelial / LPF: NONE SEEN

## 2017-03-11 LAB — GLUCOSE, CAPILLARY
GLUCOSE-CAPILLARY: 255 mg/dL — AB (ref 65–99)
GLUCOSE-CAPILLARY: 189 mg/dL — AB (ref 65–99)
Glucose-Capillary: 165 mg/dL — ABNORMAL HIGH (ref 65–99)
Glucose-Capillary: 264 mg/dL — ABNORMAL HIGH (ref 65–99)
Glucose-Capillary: 279 mg/dL — ABNORMAL HIGH (ref 65–99)

## 2017-03-11 LAB — CBC
HEMATOCRIT: 31.7 % — AB (ref 39.0–52.0)
Hemoglobin: 10.3 g/dL — ABNORMAL LOW (ref 13.0–17.0)
MCH: 30.4 pg (ref 26.0–34.0)
MCHC: 32.5 g/dL (ref 30.0–36.0)
MCV: 93.5 fL (ref 78.0–100.0)
Platelets: 263 10*3/uL (ref 150–400)
RBC: 3.39 MIL/uL — AB (ref 4.22–5.81)
RDW: 12.5 % (ref 11.5–15.5)
WBC: 13.6 10*3/uL — ABNORMAL HIGH (ref 4.0–10.5)

## 2017-03-11 MED ORDER — INSULIN DETEMIR 100 UNIT/ML ~~LOC~~ SOLN: 10.0000 [IU] | Freq: Every day | SUBCUTANEOUS | Status: DC

## 2017-03-11 MED ORDER — INSULIN DETEMIR 100 UNIT/ML ~~LOC~~ SOLN
15.0000 [IU] | Freq: Every day | SUBCUTANEOUS | Status: DC
  Administered 2017-03-11: 10 [IU] via SUBCUTANEOUS
  Administered 2017-03-12: 15 [IU] via SUBCUTANEOUS
  Filled 2017-03-11 (×3): qty 0.15

## 2017-03-11 MED ORDER — PRO-STAT SUGAR FREE PO LIQD
30.0000 mL | Freq: Three times a day (TID) | ORAL | Status: DC
  Administered 2017-03-11 – 2017-03-14 (×9): 30 mL via ORAL
  Filled 2017-03-11 (×12): qty 30
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
## 2017-03-11 NOTE — Progress Notes (Signed)
   I have reviewed this patient's chart as well as the images and discussed with Dr Melvyn Novasrtmann. The scaphoid fracture of the left wrist can be treat non-surgically. The fracture can be immobilized with a thumb spica splint. The right hand and wrist does not appear to have any acute osseous injury. He can follow up with us in our office when he is discharged from the hospital. He will need to call the office at [phone] to make an appointment to be seen by Dr Melvyn Novasrtmann.   Lambert ModySamantha Yi Falletta, PA-C 03/11/17
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
## 2017-03-11 NOTE — Progress Notes (Signed)
Results for Walter Hansen, Walter Hansen (MRN [id number]) as of 03/11/2017 10:41  Ref. Range 03/10/2017 11:57 03/10/2017 16:36 03/10/2017 21:48 03/11/2017 00:58 03/11/2017 07:36  Glucose-Capillary Latest Ref Range: 65 - 99 mg/dL 409212 (H) 811229 (H) 914202 (H) 279 (H) 255 (H)  Noted that blood sugars have been elevated and greater than 180 mg/dl.  Recommend increasing Levemir to 10 units every HS (patient weighs 107 kg) and continuing Novolog MODERATE correction scale TID & HS. Titrate dosage as needed. Will continue to monitor blood sugars while in the hospital.  Smith MinceKendra Kaceton Vieau RN BSN CDE Diabetes Coordinator Pager: [phone]  8am-5pm

## 2017-03-12 LAB — CBC
HEMATOCRIT: 31.9 % — AB (ref 39.0–52.0)
Hemoglobin: 10.6 g/dL — ABNORMAL LOW (ref 13.0–17.0)
MCH: 31.3 pg (ref 26.0–34.0)
MCHC: 33.2 g/dL (ref 30.0–36.0)
MCV: 94.1 fL (ref 78.0–100.0)
Platelets: 297 10*3/uL (ref 150–400)
RBC: 3.39 MIL/uL — ABNORMAL LOW (ref 4.22–5.81)
RDW: 12.6 % (ref 11.5–15.5)
WBC: 12.6 10*3/uL — ABNORMAL HIGH (ref 4.0–10.5)

## 2017-03-12 LAB — GLUCOSE, CAPILLARY
GLUCOSE-CAPILLARY: 296 mg/dL — AB (ref 65–99)
GLUCOSE-CAPILLARY: 214 mg/dL — AB (ref 65–99)
Glucose-Capillary: 185 mg/dL — ABNORMAL HIGH (ref 65–99)
Glucose-Capillary: 237 mg/dL — ABNORMAL HIGH (ref 65–99)

## 2017-03-12 LAB — BASIC METABOLIC PANEL
ANION GAP: 9 (ref 5–15)
BUN: 9 mg/dL (ref 6–20)
CHLORIDE: 101 mmol/L (ref 101–111)
CO2: 25 mmol/L (ref 22–32)
Calcium: 8.2 mg/dL — ABNORMAL LOW (ref 8.9–10.3)
Creatinine, Ser: 0.57 mg/dL — ABNORMAL LOW (ref 0.61–1.24)
GFR calc Af Amer: >60 mL/min (ref >60)
GLUCOSE: 194 mg/dL — AB (ref 65–99)
POTASSIUM: 3.6 mmol/L (ref 3.5–5.1)
Sodium: 135 mmol/L (ref 135–145)

## 2017-03-12 MED ORDER — INSULIN DETEMIR 100 UNIT/ML ~~LOC~~ SOLN
5.0000 [IU] | Freq: Every day | SUBCUTANEOUS | Status: DC
  Administered 2017-03-12: 5 [IU] via SUBCUTANEOUS
  Filled 2017-03-12 (×2): qty 0.05

## 2017-03-12 MED ORDER — INSULIN ASPART 100 UNIT/ML ~~LOC~~ SOLN
3.0000 [IU] | Freq: Three times a day (TID) | SUBCUTANEOUS | Status: DC
  Administered 2017-03-13 – 2017-03-15 (×7): 3 [IU] via SUBCUTANEOUS

## 2017-03-12 MED ORDER — INSULIN ASPART 100 UNIT/ML ~~LOC~~ SOLN
0.0000 [IU] | SUBCUTANEOUS | Status: DC
  Administered 2017-03-12: 5 [IU] via SUBCUTANEOUS
  Administered 2017-03-13: 2 [IU] via SUBCUTANEOUS
  Administered 2017-03-13: 5 [IU] via SUBCUTANEOUS
  Administered 2017-03-13: 3 [IU] via SUBCUTANEOUS
  Administered 2017-03-13 (×2): 5 [IU] via SUBCUTANEOUS
  Administered 2017-03-13: 8 [IU] via SUBCUTANEOUS
  Administered 2017-03-14: 5 [IU] via SUBCUTANEOUS
  Administered 2017-03-14 – 2017-03-15 (×8): 3 [IU] via SUBCUTANEOUS
  Administered 2017-03-16: 2 [IU] via SUBCUTANEOUS
  Administered 2017-03-16: 3 [IU] via SUBCUTANEOUS
  Administered 2017-03-16: 2 [IU] via SUBCUTANEOUS
  Administered 2017-03-16: 3 [IU] via SUBCUTANEOUS
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
## 2017-03-13 LAB — BASIC METABOLIC PANEL
Anion gap: 7 (ref 5–15)
BUN: 11 mg/dL (ref 6–20)
CHLORIDE: 102 mmol/L (ref 101–111)
CO2: 24 mmol/L (ref 22–32)
CREATININE: 0.62 mg/dL (ref 0.61–1.24)
Calcium: 8.1 mg/dL — ABNORMAL LOW (ref 8.9–10.3)
GFR calc Af Amer: >60 mL/min (ref >60)
GFR calc non Af Amer: >60 mL/min (ref >60)
Glucose, Bld: 211 mg/dL — ABNORMAL HIGH (ref 65–99)
Potassium: 4.1 mmol/L (ref 3.5–5.1)
Sodium: 133 mmol/L — ABNORMAL LOW (ref 135–145)

## 2017-03-13 LAB — GLUCOSE, CAPILLARY
GLUCOSE-CAPILLARY: 255 mg/dL — AB (ref 65–99)
Glucose-Capillary: 151 mg/dL — ABNORMAL HIGH (ref 65–99)
Glucose-Capillary: 193 mg/dL — ABNORMAL HIGH (ref 65–99)
Glucose-Capillary: 207 mg/dL — ABNORMAL HIGH (ref 65–99)
Glucose-Capillary: 226 mg/dL — ABNORMAL HIGH (ref 65–99)

## 2017-03-13 MED ORDER — INSULIN DETEMIR 100 UNIT/ML ~~LOC~~ SOLN
10.0000 [IU] | Freq: Two times a day (BID) | SUBCUTANEOUS | Status: DC
  Administered 2017-03-13 (×2): 10 [IU] via SUBCUTANEOUS
  Filled 2017-03-13 (×3): qty 0.1
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
## 2017-03-14 DIAGNOSIS — E559 Vitamin D deficiency, unspecified: Secondary | ICD-10-CM

## 2017-03-14 LAB — GLUCOSE, CAPILLARY
Glucose-Capillary: 164 mg/dL — ABNORMAL HIGH (ref 65–99)
Glucose-Capillary: 190 mg/dL — ABNORMAL HIGH (ref 65–99)
Glucose-Capillary: 236 mg/dL — ABNORMAL HIGH (ref 65–99)
Glucose-Capillary: 205 mg/dL — ABNORMAL HIGH (ref 65–99)
Glucose-Capillary: 177 mg/dL — ABNORMAL HIGH (ref 65–99)

## 2017-03-14 MED ORDER — INSULIN DETEMIR 100 UNIT/ML ~~LOC~~ SOLN
15.0000 [IU] | Freq: Two times a day (BID) | SUBCUTANEOUS | Status: DC
  Administered 2017-03-14 – 2017-03-16 (×4): 15 [IU] via SUBCUTANEOUS
  Filled 2017-03-14 (×5): qty 0.15

## 2017-03-14 MED ORDER — HYDROMORPHONE HCL 1 MG/ML IJ SOLN
1.0000 mg | INTRAMUSCULAR | Status: DC | PRN
  Administered 2017-03-14 – 2017-03-15 (×4): 1 mg via INTRAVENOUS
  Filled 2017-03-14 (×4): qty 1
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
## 2017-03-15 LAB — GLUCOSE, CAPILLARY
GLUCOSE-CAPILLARY: 155 mg/dL — AB (ref 65–99)
GLUCOSE-CAPILLARY: 154 mg/dL — AB (ref 65–99)
GLUCOSE-CAPILLARY: 163 mg/dL — AB (ref 65–99)
GLUCOSE-CAPILLARY: 193 mg/dL — AB (ref 65–99)
Glucose-Capillary: 152 mg/dL — ABNORMAL HIGH (ref 65–99)
Glucose-Capillary: 184 mg/dL — ABNORMAL HIGH (ref 65–99)

## 2017-03-15 LAB — BASIC METABOLIC PANEL
ANION GAP: 8 (ref 5–15)
BUN: 9 mg/dL (ref 6–20)
CHLORIDE: 101 mmol/L (ref 101–111)
CO2: 28 mmol/L (ref 22–32)
Calcium: 8.5 mg/dL — ABNORMAL LOW (ref 8.9–10.3)
Creatinine, Ser: 0.57 mg/dL — ABNORMAL LOW (ref 0.61–1.24)
GFR calc non Af Amer: >60 mL/min (ref >60)
Glucose, Bld: 158 mg/dL — ABNORMAL HIGH (ref 65–99)
POTASSIUM: 3.9 mmol/L (ref 3.5–5.1)
SODIUM: 137 mmol/L (ref 135–145)

## 2017-03-15 MED ORDER — NYSTATIN 100000 UNIT/GM EX POWD
Freq: Two times a day (BID) | CUTANEOUS | Status: DC
  Administered 2017-03-15 – 2017-03-16 (×3): via TOPICAL
  Filled 2017-03-15: qty 15

## 2017-03-15 MED ORDER — HYDROMORPHONE HCL 1 MG/ML IJ SOLN
1.0000 mg | INTRAMUSCULAR | Status: DC | PRN
  Administered 2017-03-15 (×2): 1 mg via INTRAVENOUS
  Filled 2017-03-15 (×2): qty 1
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
## 2017-03-15 NOTE — Progress Notes (Addendum)
Ok to transition to SNF on current insulin regimen. No new recommendations from my end today.  VSS.  Walter Hansen  Signing off. Should any new concerns arise may page me at [phone] or call flow manager if needing reconsult [phone]
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
## 2017-03-16 DIAGNOSIS — S2239XA Fracture of one rib, unspecified side, initial encounter for closed fracture: Secondary | ICD-10-CM

## 2017-03-16 DIAGNOSIS — S2249XA Multiple fractures of ribs, unspecified side, initial encounter for closed fracture: Secondary | ICD-10-CM

## 2017-03-16 LAB — GLUCOSE, CAPILLARY
GLUCOSE-CAPILLARY: 163 mg/dL — AB (ref 65–99)
GLUCOSE-CAPILLARY: 111 mg/dL — AB (ref 65–99)
GLUCOSE-CAPILLARY: 136 mg/dL — AB (ref 65–99)
Glucose-Capillary: 140 mg/dL — ABNORMAL HIGH (ref 65–99)
Glucose-Capillary: 175 mg/dL — ABNORMAL HIGH (ref 65–99)

## 2017-03-16 MED ORDER — INSULIN DETEMIR 100 UNIT/ML ~~LOC~~ SOLN: 15.0000 [IU] | Freq: Two times a day (BID) | SUBCUTANEOUS | 11 refills | Status: DC

## 2017-03-16 MED ORDER — INSULIN ASPART 100 UNIT/ML ~~LOC~~ SOLN: 3.0000 [IU] | Freq: Three times a day (TID) | SUBCUTANEOUS | 11 refills | Status: AC

## 2017-03-16 MED ORDER — ACETAMINOPHEN 325 MG PO TABS: 650.0000 mg | ORAL_TABLET | Freq: Four times a day (QID) | ORAL | Status: DC | PRN

## 2017-03-16 MED ORDER — OXYCODONE-ACETAMINOPHEN 5-325 MG PO TABS: 1.0000 | ORAL_TABLET | Freq: Four times a day (QID) | ORAL | 0 refills | Status: DC | PRN

## 2017-03-16 MED ORDER — NYSTATIN 100000 UNIT/GM EX POWD: Freq: Two times a day (BID) | CUTANEOUS | 0 refills | Status: DC

## 2017-03-16 MED ORDER — DOCUSATE SODIUM 100 MG PO CAPS: 100.0000 mg | ORAL_CAPSULE | Freq: Two times a day (BID) | ORAL | 0 refills | Status: DC

## 2017-03-16 MED ORDER — ASCORBIC ACID 1000 MG PO TABS: 1000.0000 mg | ORAL_TABLET | Freq: Every day | ORAL | Status: DC

## 2017-03-16 MED ORDER — INSULIN ASPART 100 UNIT/ML ~~LOC~~ SOLN: 0.0000 [IU] | SUBCUTANEOUS | 11 refills | Status: DC

## 2017-03-16 MED ORDER — ONDANSETRON HCL 4 MG PO TABS: 4.0000 mg | ORAL_TABLET | Freq: Four times a day (QID) | ORAL | 0 refills | Status: DC | PRN

## 2017-03-16 MED ORDER — GLUCERNA SHAKE PO LIQD: 237.0000 mL | Freq: Three times a day (TID) | ORAL | 0 refills | Status: DC

## 2017-03-16 MED ORDER — METHOCARBAMOL 500 MG PO TABS: 1000.0000 mg | ORAL_TABLET | Freq: Four times a day (QID) | ORAL | Status: DC

## 2017-03-16 MED ORDER — CHOLECALCIFEROL 50 MCG (2000 UT) PO TABS: 2000.0000 [IU] | ORAL_TABLET | Freq: Two times a day (BID) | ORAL | Status: DC

## 2017-03-16 NOTE — Progress Notes (Signed)
Patient discharge instructions reviewed with patient. Patient verbalizes understanding. Patient is waiting on transport. Patient belongings with patient. Patient is not in distress.
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
## 2017-03-16 NOTE — Progress Notes (Signed)
Orthopedic Tech Progress Note Patient Details:  Walter Hansen 08/29/1981 [id number]  Patient ID: Walter Hansen, male   DOB: 01/26/1981, 36 y.o.   MRN: [id number]   [name] 03/16/2017, 11:12 AMCalled [facility] for right Hinged knee brace.

## 2017-03-16 NOTE — Discharge Instructions (Signed)
Orthopaedic Trauma Service Discharge Instructions   General Discharge Instructions  WEIGHT BEARING STATUS: Nonweightbearing right leg  RANGE OF MOTION/ACTIVITY: Posterior hip precautions right hip, no range of motion of right knee or ankle. Do not bend knee.  Wound Care: Dressing changes as needed to right hip. Okay to leave open to air. Do not apply any lotions or ointments to incision. See instructions below  Discharge Wound Care Instructions  Do NOT apply any ointments, solutions or lotions to pin sites or surgical wounds.  These prevent needed drainage and even though solutions like hydrogen peroxide kill bacteria, they also damage cells lining the pin sites that help fight infection.  Applying lotions or ointments can keep the wounds moist and can cause them to breakdown and open up as well. This can increase the risk for infection. When in doubt call the office.  Surgical incisions should be dressed daily.  If any drainage is noted, use one layer of adaptic, then gauze, Kerlix, and an ace wrap.  Once the incision is completely dry and without drainage, it may be left open to air out.  Showering may begin 36-48 hours later.  Cleaning gently with soap and water.  Traumatic wounds should be dressed daily as well.    One layer of adaptic, gauze, Kerlix, then ace wrap.  The adaptic can be discontinued once the draining has ceased    If you have a wet to dry dressing: wet the gauze with saline the squeeze as much saline out so the gauze is moist (not soaking wet), place moistened gauze over wound, then place a dry gauze over the moist one, followed by Kerlix wrap, then ace wrap.  PAIN MEDICATION USE AND EXPECTATIONS  You have likely been given narcotic medications to help control your pain.  After a traumatic event that results in an fracture (broken bone) with or without surgery, it is ok to use narcotic pain medications to help control one's pain.  We understand that everyone responds to  pain differently and each individual patient will be evaluated on a regular basis for the continued need for narcotic medications. Ideally, narcotic medication use should last no more than 6-8 weeks (coinciding with fracture healing).   As a patient it is your responsibility as well to monitor narcotic medication use and report the amount and frequency you use these medications when you come to your office visit.   We would also advise that if you are using narcotic medications, you should take a dose prior to therapy to maximize you participation.  IF YOU ARE ON NARCOTIC MEDICATIONS IT IS NOT PERMISSIBLE TO OPERATE A MOTOR VEHICLE (MOTORCYCLE/CAR/TRUCK/MOPED) OR HEAVY MACHINERY DO NOT MIX NARCOTICS WITH OTHER CNS (CENTRAL NERVOUS SYSTEM) DEPRESSANTS SUCH AS ALCOHOL  Diet: as you were eating previously.  Can use over the counter stool softeners and bowel preparations, such as Miralax, to help with bowel movements.  Narcotics can be constipating.  Be sure to drink plenty of fluids    STOP SMOKING OR USING NICOTINE PRODUCTS!!!!  As discussed nicotine severely impairs your body's ability to heal surgical and traumatic wounds but also impairs bone healing.  Wounds and bone heal by forming microscopic blood vessels (angiogenesis) and nicotine is a vasoconstrictor (essentially, shrinks blood vessels).  Therefore, if vasoconstriction occurs to these microscopic blood vessels they essentially disappear and are unable to deliver necessary nutrients to the healing tissue.  This is one modifiable factor that you can do to dramatically increase your chances of healing your  injury.    (This means no smoking, no nicotine gum, patches, etc)  DO NOT USE NONSTEROIDAL ANTI-INFLAMMATORY DRUGS (NSAID'S)  Using products such as Advil (ibuprofen), Aleve (naproxen), Motrin (ibuprofen) for additional pain control during fracture healing can delay and/or prevent the healing response.  If you would like to take over the  counter (OTC) medication, Tylenol (acetaminophen) is ok.  However, some narcotic medications that are given for pain control contain acetaminophen as well. Therefore, you should not exceed more than 4000 mg of tylenol in a day if you do not have liver disease.  Also note that there are may OTC medicines, such as cold medicines and allergy medicines that my contain tylenol as well.  If you have any questions about medications and/or interactions please ask your doctor/PA or your pharmacist.      ICE AND ELEVATE INJURED/OPERATIVE EXTREMITY  Using ice and elevating the injured extremity above your heart can help with swelling and pain control.  Icing in a pulsatile fashion, such as 20 minutes on and 20 minutes off, can be followed.    Do not place ice directly on skin. Make sure there is a barrier between to skin and the ice pack.    Using frozen items such as frozen peas works well as the conform nicely to the are that needs to be iced.  USE AN ACE WRAP OR TED HOSE FOR SWELLING CONTROL  In addition to icing and elevation, Ace wraps or TED hose are used to help limit and resolve swelling.  It is recommended to use Ace wraps or TED hose until you are informed to stop.    When using Ace Wraps start the wrapping distally (farthest away from the body) and wrap proximally (closer to the body)   Example: If you had surgery on your leg or thing and you do not have a splint on, start the ace wrap at the toes and work your way up to the thigh        If you had surgery on your upper extremity and do not have a splint on, start the ace wrap at your fingers and work your way up to the upper arm  IF YOU ARE IN A SPLINT OR CAST DO NOT REMOVE IT FOR ANY REASON   If your splint gets wet for any reason please contact the office immediately. You may shower in your splint or cast as long as you keep it dry.  This can be done by wrapping in a cast cover or garbage back (or similar)  Do Not stick any thing down your splint  or cast such as pencils, money, or hangers to try and scratch yourself with.  If you feel itchy take benadryl as prescribed on the bottle for itching  IF YOU ARE IN A CAM BOOT (BLACK BOOT)  You may remove boot periodically. Perform daily dressing changes as noted below.  Wash the liner of the boot regularly and wear a sock when wearing the boot. It is recommended that you sleep in the boot until told otherwise  CALL THE OFFICE WITH ANY QUESTIONS OR CONCERNS: [phone]

## 2017-03-16 NOTE — Progress Notes (Signed)
PT Cancellation Note  Patient Details Name: Walter Hansen MRN: [id number] DOB: 06/17/1981   Cancelled Treatment:    Reason Eval/Treat Not Completed: (P) Other (comment) (Pt to d/c to SNF, will defer PT needs to facility at this time.  )   Florestine AversAimee J Zailen Albarran 03/16/2017, 2:08 PM  Joycelyn RuaAimee Charish Schroepfer, PTA pager [phone]

## 2017-03-16 NOTE — Progress Notes (Signed)
Patient discharged via [facility]. RN attempted to call SNF four times.

## 2017-03-16 NOTE — Progress Notes (Signed)
Paged ortho tech about ordering hinged knee brace for patient.

## 2017-03-16 NOTE — Progress Notes (Signed)
RN attempted to give report to SNF. 

## 2017-03-16 NOTE — Clinical Social Work Placement (Addendum)
   CLINICAL SOCIAL WORK PLACEMENT  NOTE  Date:  03/16/2017  Patient Details  Name: Walter Hansen MRN: [id number] Date of Birth: 11/01/1980  Clinical Social Work is seeking post-discharge placement for this patient at the Skilled  Nursing Facility level of care (*CSW will initial, date and re-position this form in  chart as items are completed):  Yes   Patient/family provided with [facility] Clinical Social Work Department's list of facilities offering this level of care within the geographic area requested by the patient (or if unable, by the patient's family).  Yes   Patient/family informed of their freedom to choose among providers that offer the needed level of care, that participate in Medicare, Medicaid or managed care program needed by the patient, have an available bed and are willing to accept the patient.  Yes   Patient/family informed of [place]'s ownership interest in [facility] and [facility], as well as of the fact that they are under no obligation to receive care at these facilities.  PASRR submitted to EDS on       PASRR number received on 03/12/17     Existing PASRR number confirmed on 03/12/17     FL2 transmitted to all facilities in geographic area requested by pt/family on 03/12/17     FL2 transmitted to all facilities within larger geographic area on 03/12/17     Patient informed that his/her managed care company has contracts with or will negotiate with certain facilities, including the following:        Yes   Patient/family informed of bed offers received.  [facility]     Physician recommends and patient chooses bed at      [facility] on 03/16/17.  Patient to be transferred to facility by [facility]     Patient family notified on 03/16/17 of transfer.  Name of family member notified:  [name]  and step mother  PHYSICIAN Please prepare priority discharge summary,  including medications, Please prepare prescriptions, Please sign FL2     Additional Comment:    _______________________________________________ Tresa MoorePatricia V Tyquasia Pant, LCSW 03/16/2017, 11:06 AM
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
## 2017-03-16 NOTE — Social Work (Signed)
Clinical Social Worker facilitated patient discharge including contacting patient family and facility to confirm patient discharge plans.  Clinical information faxed to facility and family agreeable with plan.  CSW arranged ambulance transport via [facility].  RN to call [phone] report prior to discharge.  Clinical Social Worker will sign off for now as social work intervention is no longer needed. Please consult us again if new need arises.  Keene BreathPatricia Cambryn Charters, LCSW Clinical Social Worker [phone]

## 2017-03-18 ENCOUNTER — Encounter (HOSPITAL_COMMUNITY): Payer: Self-pay | Admitting: Orthopedic Surgery
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
## 2017-05-10 ENCOUNTER — Encounter (HOSPITAL_COMMUNITY): Payer: Self-pay | Admitting: *Deleted

## 2017-05-10 NOTE — Progress Notes (Signed)
Spoke with [place], LPN for pre-op call for pt. [facility]. Jeanice LimHolly states that pt is telling her that he didn't know he was having surgery tomorrow and doesn't want to have it. She states she is going to have pt's uncle talk with him. She states pt has refused all medications except for his insulin and he allows them to check his blood sugar. She states his fasting blood sugar is usually around 100-130. Last A1C was 12.8 on 03/05/17. No repeat has been done since then. Jeanice LimHolly states pt is a poor historian. [facility]

## 2017-05-10 NOTE — Progress Notes (Signed)
Spoke with Dr. Jacklynn BueMassagee, anesthesiologist to make him aware of pt's  A1C. Informed him that pt's FBS have been between 100-130. He stated that's all that can be done.

## 2017-05-10 NOTE — Pre-Procedure Instructions (Signed)
    Walter Hansen  05/10/2017     [name]'s procedure is scheduled on Tuesday, May 11, 2017 at 5112 Noon.   [facility]  Entrance "A" Admitting Office at 9:30 AM.   Call this number if you have problems the morning of surgery: [phone]    Remember:  Patient is not to eat food or drink liquids after midnight tonight.  Take these medicines the morning of surgery with A SIP OF WATER: Oxycodone or Tylenol - if needed    How do I manage my blood sugars before surgery?   Check your blood sugar at least 4 times a day, 2 days before surgery to make sure that they are not too high or low.  Check your blood sugar the morning of your surgery when you wake up and every 2 hours until you get to the Short-Stay unit.  Treat a low blood sugar (less than 70 mg/dL) with 1/2 cup of clear juice (cranberry or apple), 4 glucose tablets, OR glucose gel.  Recheck blood sugar in 15 minutes after treatment (to make sure it is greater than 70 mg/dL).  If blood sugar is not greater than 70 mg/dL on re-check, call [phone] for further instructions.   Report your blood sugar to the Short-Stay nurse when you get to Short-Stay.  References:  [facility], 2007 "How to Manage your Diabetes Before and After Surgery".  What do I do about my diabetes medications?   THE NIGHT BEFORE SURGERY, take 7 units of Levemir Insulin.    THE MORNING OF SURGERY, take 7 units of Levemir Insulin.  If blood sugar is 220 or greater, treat with 1/2 dose Novolog Sliding Scale Insulin.   Do not wear jewelry.  Do not wear lotions, powders, cologne or deodorant.  Men may shave face and neck.  Do not bring valuables to the hospital.  [facility] is not responsible for any belongings or valuables.  Contacts, dentures or bridgework may not be worn into surgery.   For patients admitted to the hospital, discharge time will be determined by your treatment team.  Any  questions today, please call me, Hyman Bibleeresa Ramond Darnell, RN at [phone].

## 2017-05-11 ENCOUNTER — Encounter (HOSPITAL_COMMUNITY)
Admission: RE | Disposition: A | Discharge status: Skilled Nursing Facility | Payer: Self-pay | Source: Home / Self Care | Attending: Orthopedic Surgery

## 2017-05-11 ENCOUNTER — Encounter (HOSPITAL_COMMUNITY): Payer: Self-pay | Admitting: Anesthesiology

## 2017-05-11 ENCOUNTER — Inpatient Hospital Stay (HOSPITAL_COMMUNITY): Payer: BLUE CROSS/BLUE SHIELD | Admitting: Anesthesiology

## 2017-05-11 ENCOUNTER — Inpatient Hospital Stay (HOSPITAL_COMMUNITY): Payer: BLUE CROSS/BLUE SHIELD

## 2017-05-11 ENCOUNTER — Inpatient Hospital Stay (HOSPITAL_COMMUNITY)
Admission: RE | Admit: 2017-05-11 | Discharge: 2017-05-14 | DRG: 623 | Disposition: A | Discharge status: Skilled Nursing Facility | Payer: BLUE CROSS/BLUE SHIELD | Attending: Orthopedic Surgery | Admitting: Orthopedic Surgery

## 2017-05-11 DIAGNOSIS — E1169 Type 2 diabetes mellitus with other specified complication: Principal | ICD-10-CM | POA: Diagnosis present

## 2017-05-11 DIAGNOSIS — S82891A Other fracture of right lower leg, initial encounter for closed fracture: Secondary | ICD-10-CM | POA: Diagnosis present

## 2017-05-11 DIAGNOSIS — E559 Vitamin D deficiency, unspecified: Secondary | ICD-10-CM | POA: Diagnosis present

## 2017-05-11 DIAGNOSIS — Z880 Allergy status to penicillin: Secondary | ICD-10-CM

## 2017-05-11 DIAGNOSIS — Z79899 Other long term (current) drug therapy: Secondary | ICD-10-CM

## 2017-05-11 DIAGNOSIS — Y838 Other surgical procedures as the cause of abnormal reaction of the patient, or of later complication, without mention of misadventure at the time of the procedure: Secondary | ICD-10-CM | POA: Diagnosis present

## 2017-05-11 DIAGNOSIS — M24561 Contracture, right knee: Secondary | ICD-10-CM | POA: Diagnosis present

## 2017-05-11 DIAGNOSIS — Z419 Encounter for procedure for purposes other than remedying health state, unspecified: Secondary | ICD-10-CM

## 2017-05-11 DIAGNOSIS — Z794 Long term (current) use of insulin: Secondary | ICD-10-CM

## 2017-05-11 DIAGNOSIS — D62 Acute posthemorrhagic anemia: Secondary | ICD-10-CM | POA: Diagnosis not present

## 2017-05-11 DIAGNOSIS — T8489XA Other specified complication of internal orthopedic prosthetic devices, implants and grafts, initial encounter: Secondary | ICD-10-CM | POA: Diagnosis present

## 2017-05-11 DIAGNOSIS — M869 Osteomyelitis, unspecified: Secondary | ICD-10-CM | POA: Diagnosis present

## 2017-05-11 DIAGNOSIS — S8251XK Displaced fracture of medial malleolus of right tibia, subsequent encounter for closed fracture with nonunion: Secondary | ICD-10-CM

## 2017-05-11 HISTORY — PX: HARDWARE REMOVAL: SHX979

## 2017-05-11 HISTORY — DX: Type 2 diabetes mellitus without complications: E11.9

## 2017-05-11 LAB — BASIC METABOLIC PANEL
ANION GAP: 13 (ref 5–15)
BUN: 9 mg/dL (ref 6–20)
CALCIUM: 9.5 mg/dL (ref 8.9–10.3)
CO2: 23 mmol/L (ref 22–32)
Chloride: 99 mmol/L — ABNORMAL LOW (ref 101–111)
Creatinine, Ser: 0.64 mg/dL (ref 0.61–1.24)
Glucose, Bld: 297 mg/dL — ABNORMAL HIGH (ref 65–99)
POTASSIUM: 3.7 mmol/L (ref 3.5–5.1)
Sodium: 135 mmol/L (ref 135–145)

## 2017-05-11 LAB — CBC
HCT: 39.7 % (ref 39.0–52.0)
HCT: 37.1 % — ABNORMAL LOW (ref 39.0–52.0)
HEMOGLOBIN: 12.9 g/dL — AB (ref 13.0–17.0)
HEMOGLOBIN: 12.2 g/dL — AB (ref 13.0–17.0)
MCH: 27.1 pg (ref 26.0–34.0)
MCH: 27.4 pg (ref 26.0–34.0)
MCHC: 32.5 g/dL (ref 30.0–36.0)
MCHC: 32.9 g/dL (ref 30.0–36.0)
MCV: 83.4 fL (ref 78.0–100.0)
MCV: 83.2 fL (ref 78.0–100.0)
Platelets: 410 10*3/uL — ABNORMAL HIGH (ref 150–400)
Platelets: 342 10*3/uL (ref 150–400)
RBC: 4.76 MIL/uL (ref 4.22–5.81)
RBC: 4.46 MIL/uL (ref 4.22–5.81)
RDW: 14.3 % (ref 11.5–15.5)
RDW: 14.8 % (ref 11.5–15.5)
WBC: 13 10*3/uL — ABNORMAL HIGH (ref 4.0–10.5)
WBC: 13.9 10*3/uL — ABNORMAL HIGH (ref 4.0–10.5)

## 2017-05-11 LAB — GLUCOSE, CAPILLARY
GLUCOSE-CAPILLARY: 279 mg/dL — AB (ref 65–99)
GLUCOSE-CAPILLARY: 233 mg/dL — AB (ref 65–99)
GLUCOSE-CAPILLARY: 219 mg/dL — AB (ref 65–99)
GLUCOSE-CAPILLARY: 221 mg/dL — AB (ref 65–99)
Glucose-Capillary: 252 mg/dL — ABNORMAL HIGH (ref 65–99)

## 2017-05-11 LAB — CREATININE, SERUM
CREATININE: 0.52 mg/dL — AB (ref 0.61–1.24)
GFR calc Af Amer: >60 mL/min (ref >60)
GFR calc non Af Amer: >60 mL/min (ref >60)

## 2017-05-11 SURGERY — REMOVAL, HARDWARE
Anesthesia: General | Site: Ankle | Laterality: Right

## 2017-05-11 MED ORDER — ENOXAPARIN SODIUM 40 MG/0.4ML ~~LOC~~ SOLN
40.0000 mg | SUBCUTANEOUS | Status: DC
Start: 2017-05-12 — End: 2017-05-14
  Administered 2017-05-12 – 2017-05-14 (×3): 40 mg via SUBCUTANEOUS
  Filled 2017-05-11 (×3): qty 0.4

## 2017-05-11 MED ORDER — FENTANYL CITRATE (PF) 100 MCG/2ML IJ SOLN
INTRAMUSCULAR | Status: DC | PRN
  Administered 2017-05-11: 25 ug via INTRAVENOUS
  Administered 2017-05-11 (×4): 50 ug via INTRAVENOUS
  Administered 2017-05-11: 25 ug via INTRAVENOUS

## 2017-05-11 MED ORDER — VANCOMYCIN HCL IN DEXTROSE 1-5 GM/200ML-% IV SOLN
1000.0000 mg | Freq: Three times a day (TID) | INTRAVENOUS | Status: DC
  Administered 2017-05-12 – 2017-05-13 (×4): 1000 mg via INTRAVENOUS
  Filled 2017-05-11 (×7): qty 200

## 2017-05-11 MED ORDER — ACETAMINOPHEN 325 MG PO TABS: 650.0000 mg | ORAL_TABLET | Freq: Four times a day (QID) | ORAL | Status: DC | PRN

## 2017-05-11 MED ORDER — FENTANYL CITRATE (PF) 250 MCG/5ML IJ SOLN
INTRAMUSCULAR | Status: AC
  Filled 2017-05-11: qty 5

## 2017-05-11 MED ORDER — ONDANSETRON HCL 4 MG PO TABS: 4.0000 mg | ORAL_TABLET | Freq: Four times a day (QID) | ORAL | Status: DC | PRN

## 2017-05-11 MED ORDER — LACTATED RINGERS IV SOLN
INTRAVENOUS | Status: DC
  Administered 2017-05-11 (×3): via INTRAVENOUS

## 2017-05-11 MED ORDER — OXYCODONE HCL 5 MG PO TABS
5.0000 mg | ORAL_TABLET | ORAL | Status: DC | PRN
  Administered 2017-05-13 – 2017-05-14 (×2): 10 mg via ORAL
  Filled 2017-05-11: qty 2

## 2017-05-11 MED ORDER — DEXAMETHASONE SODIUM PHOSPHATE 10 MG/ML IJ SOLN
INTRAMUSCULAR | Status: AC
  Filled 2017-05-11: qty 1

## 2017-05-11 MED ORDER — LIDOCAINE HCL (CARDIAC) 20 MG/ML IV SOLN
INTRAVENOUS | Status: DC | PRN
  Administered 2017-05-11: 60 mg via INTRAVENOUS

## 2017-05-11 MED ORDER — CEFAZOLIN SODIUM-DEXTROSE 2-4 GM/100ML-% IV SOLN
INTRAVENOUS | Status: AC
  Filled 2017-05-11: qty 100

## 2017-05-11 MED ORDER — ONDANSETRON HCL 4 MG/2ML IJ SOLN
INTRAMUSCULAR | Status: DC | PRN
  Administered 2017-05-11: 4 mg via INTRAVENOUS

## 2017-05-11 MED ORDER — MORPHINE SULFATE (PF) 4 MG/ML IV SOLN
2.0000 mg | INTRAVENOUS | Status: DC | PRN
  Administered 2017-05-11: 2 mg via INTRAVENOUS
  Filled 2017-05-11: qty 1

## 2017-05-11 MED ORDER — MIDAZOLAM HCL 2 MG/2ML IJ SOLN
INTRAMUSCULAR | Status: AC
  Filled 2017-05-11: qty 2

## 2017-05-11 MED ORDER — CEFAZOLIN SODIUM-DEXTROSE 2-4 GM/100ML-% IV SOLN
2.0000 g | Freq: Once | INTRAVENOUS | Status: AC
  Administered 2017-05-11: 2 g via INTRAVENOUS

## 2017-05-11 MED ORDER — INSULIN DETEMIR 100 UNIT/ML ~~LOC~~ SOLN
15.0000 [IU] | Freq: Two times a day (BID) | SUBCUTANEOUS | Status: DC
  Administered 2017-05-11 – 2017-05-14 (×5): 15 [IU] via SUBCUTANEOUS
  Filled 2017-05-11 (×7): qty 0.15

## 2017-05-11 MED ORDER — DOCUSATE SODIUM 100 MG PO CAPS
100.0000 mg | ORAL_CAPSULE | Freq: Two times a day (BID) | ORAL | Status: DC
  Filled 2017-05-11 (×3): qty 1

## 2017-05-11 MED ORDER — MIDAZOLAM HCL 5 MG/5ML IJ SOLN
INTRAMUSCULAR | Status: DC | PRN
  Administered 2017-05-11: 2 mg via INTRAVENOUS

## 2017-05-11 MED ORDER — VANCOMYCIN HCL 10 G IV SOLR
1250.0000 mg | Freq: Once | INTRAVENOUS | Status: AC
  Administered 2017-05-11: 1250 mg via INTRAVENOUS
  Filled 2017-05-11: qty 1250

## 2017-05-11 MED ORDER — ACETAMINOPHEN 650 MG RE SUPP
650.0000 mg | Freq: Four times a day (QID) | RECTAL | Status: DC | PRN
Start: 2017-05-11 — End: 2017-05-14

## 2017-05-11 MED ORDER — PROPOFOL 10 MG/ML IV BOLUS
INTRAVENOUS | Status: AC
  Filled 2017-05-11: qty 20

## 2017-05-11 MED ORDER — ONDANSETRON HCL 4 MG/2ML IJ SOLN
INTRAMUSCULAR | Status: AC
  Filled 2017-05-11: qty 2

## 2017-05-11 MED ORDER — ONDANSETRON HCL 4 MG/2ML IJ SOLN: 4.0000 mg | Freq: Four times a day (QID) | INTRAMUSCULAR | Status: DC | PRN

## 2017-05-11 MED ORDER — PROPOFOL 10 MG/ML IV BOLUS
INTRAVENOUS | Status: DC | PRN
  Administered 2017-05-11: 150 mg via INTRAVENOUS
  Administered 2017-05-11: 50 mg via INTRAVENOUS

## 2017-05-11 MED ORDER — METHOCARBAMOL 500 MG PO TABS
1000.0000 mg | ORAL_TABLET | Freq: Four times a day (QID) | ORAL | Status: DC
  Administered 2017-05-14: 1000 mg via ORAL
  Filled 2017-05-11 (×5): qty 2

## 2017-05-11 MED ORDER — DEXAMETHASONE SODIUM PHOSPHATE 10 MG/ML IJ SOLN
INTRAMUSCULAR | Status: DC | PRN
  Administered 2017-05-11: 4 mg via INTRAVENOUS

## 2017-05-11 MED ORDER — LIDOCAINE 2% (20 MG/ML) 5 ML SYRINGE
INTRAMUSCULAR | Status: AC
  Filled 2017-05-11: qty 5

## 2017-05-11 SURGICAL SUPPLY — 60 items
BANDAGE ACE 4X5 VEL STRL LF (GAUZE/BANDAGES/DRESSINGS) ×3 IMPLANT
BANDAGE ACE 6X5 VEL STRL LF (GAUZE/BANDAGES/DRESSINGS) ×3 IMPLANT
BANDAGE ESMARK 6X9 LF (GAUZE/BANDAGES/DRESSINGS) ×1 IMPLANT
BNDG CMPR 9X6 STRL LF SNTH (GAUZE/BANDAGES/DRESSINGS) ×1
BNDG COHESIVE 6X5 TAN STRL LF (GAUZE/BANDAGES/DRESSINGS) ×3 IMPLANT
BNDG ESMARK 6X9 LF (GAUZE/BANDAGES/DRESSINGS) ×3
BNDG GAUZE ELAST 4 BULKY (GAUZE/BANDAGES/DRESSINGS) ×6 IMPLANT
BRUSH SCRUB SURG 4.25 DISP (MISCELLANEOUS) ×6 IMPLANT
CANISTER WOUND CARE 500ML ATS (WOUND CARE) ×2 IMPLANT
CLOSURE WOUND 1/2 X4 (GAUZE/BANDAGES/DRESSINGS)
COVER SURGICAL LIGHT HANDLE (MISCELLANEOUS) ×6 IMPLANT
CUFF TOURNIQUET SINGLE 18IN (TOURNIQUET CUFF) IMPLANT
CUFF TOURNIQUET SINGLE 24IN (TOURNIQUET CUFF) IMPLANT
CUFF TOURNIQUET SINGLE 34IN LL (TOURNIQUET CUFF) ×2 IMPLANT
DRAPE C-ARM 42X72 X-RAY (DRAPES) IMPLANT
DRAPE C-ARMOR (DRAPES) ×3 IMPLANT
DRAPE U-SHAPE 47X51 STRL (DRAPES) ×3 IMPLANT
DRSG ADAPTIC 3X8 NADH LF (GAUZE/BANDAGES/DRESSINGS) ×3 IMPLANT
DRSG VAC ATS SM SENSATRAC (GAUZE/BANDAGES/DRESSINGS) ×2 IMPLANT
ELECT REM PT RETURN 9FT ADLT (ELECTROSURGICAL) ×3
ELECTRODE REM PT RTRN 9FT ADLT (ELECTROSURGICAL) ×1 IMPLANT
GAUZE SPONGE 4X4 12PLY STRL (GAUZE/BANDAGES/DRESSINGS) ×3 IMPLANT
GLOVE BIO SURGEON STRL SZ7.5 (GLOVE) ×3 IMPLANT
GLOVE BIO SURGEON STRL SZ8 (GLOVE) ×3 IMPLANT
GLOVE BIOGEL PI IND STRL 7.5 (GLOVE) ×1 IMPLANT
GLOVE BIOGEL PI IND STRL 8 (GLOVE) ×1 IMPLANT
GLOVE BIOGEL PI INDICATOR 7.5 (GLOVE) ×2
GLOVE BIOGEL PI INDICATOR 8 (GLOVE) ×2
GOWN STRL REUS W/ TWL LRG LVL3 (GOWN DISPOSABLE) ×2 IMPLANT
GOWN STRL REUS W/ TWL XL LVL3 (GOWN DISPOSABLE) ×1 IMPLANT
GOWN STRL REUS W/TWL LRG LVL3 (GOWN DISPOSABLE) ×6
GOWN STRL REUS W/TWL XL LVL3 (GOWN DISPOSABLE) ×3
KIT BASIN OR (CUSTOM PROCEDURE TRAY) ×3 IMPLANT
KIT ROOM TURNOVER OR (KITS) ×3 IMPLANT
MANIFOLD NEPTUNE II (INSTRUMENTS) ×3 IMPLANT
NEEDLE 22X1 1/2 (OR ONLY) (NEEDLE) IMPLANT
NS IRRIG 1000ML POUR BTL (IV SOLUTION) ×3 IMPLANT
PACK ORTHO EXTREMITY (CUSTOM PROCEDURE TRAY) ×3 IMPLANT
PAD ARMBOARD 7.5X6 YLW CONV (MISCELLANEOUS) ×6 IMPLANT
PADDING CAST COTTON 6X4 STRL (CAST SUPPLIES) ×9 IMPLANT
SPONGE LAP 18X18 X RAY DECT (DISPOSABLE) ×3 IMPLANT
STAPLER VISISTAT 35W (STAPLE) IMPLANT
STOCKINETTE IMPERVIOUS LG (DRAPES) ×3 IMPLANT
STRIP CLOSURE SKIN 1/2X4 (GAUZE/BANDAGES/DRESSINGS) IMPLANT
SUCTION FRAZIER HANDLE 10FR (MISCELLANEOUS)
SUCTION TUBE FRAZIER 10FR DISP (MISCELLANEOUS) IMPLANT
SUT ETHILON 3 0 PS 1 (SUTURE) IMPLANT
SUT PDS AB 2-0 CT1 27 (SUTURE) IMPLANT
SUT VIC AB 0 CT1 27 (SUTURE)
SUT VIC AB 0 CT1 27XBRD ANBCTR (SUTURE) IMPLANT
SUT VIC AB 2-0 CT1 27 (SUTURE)
SUT VIC AB 2-0 CT1 TAPERPNT 27 (SUTURE) IMPLANT
SYR CONTROL 10ML LL (SYRINGE) IMPLANT
TOWEL OR 17X24 6PK STRL BLUE (TOWEL DISPOSABLE) ×6 IMPLANT
TOWEL OR 17X26 10 PK STRL BLUE (TOWEL DISPOSABLE) ×6 IMPLANT
TUBE CONNECTING 12'X1/4 (SUCTIONS) ×1
TUBE CONNECTING 12X1/4 (SUCTIONS) ×2 IMPLANT
UNDERPAD 30X30 (UNDERPADS AND DIAPERS) ×3 IMPLANT
WATER STERILE IRR 1000ML POUR (IV SOLUTION) ×6 IMPLANT
YANKAUER SUCT BULB TIP NO VENT (SUCTIONS) ×3 IMPLANT

## 2017-05-11 NOTE — Anesthesia Postprocedure Evaluation (Signed)
Anesthesia Post Note  Patient: Walter EvesWilliam Michael Hansen  Procedure(s) Performed: Procedure(s) (LRB): HARDWARE REMOVAL OF RIGHT ANKLE (Right)     Patient location during evaluation: PACU Anesthesia Type: General Level of consciousness: awake and alert Pain management: pain level controlled Vital Signs Assessment: post-procedure vital signs reviewed and stable Respiratory status: spontaneous breathing, nonlabored ventilation, respiratory function stable and patient connected to nasal cannula oxygen Cardiovascular status: blood pressure returned to baseline and stable Postop Assessment: no signs of nausea or vomiting Anesthetic complications: no    Last Vitals:  Vitals:   05/11/17 1515 05/11/17 1529  BP:  139/89  Pulse:  (!) 117  Resp:  (!) 22  Temp: 36.6 C     Last Pain:  Vitals:   05/11/17 1515  TempSrc:   PainSc: Asleep                 Lamaria Hildebrandt,W. EDMOND

## 2017-05-11 NOTE — H&P (Signed)
Orthopaedic Trauma Service H&P/Consult     Chief Complaint: Left medial malleolus osteomyelitis HPI: Walter Hansen is an 36 y.o. male. With exposed hardware following polytrauma and surgical treatment.   Past Medical History:  Diagnosis Date  . Closed dislocation of right hip ([facility]) 03/06/2017  . Closed right ankle fracture 03/06/2017  . Comminuted fracture of right patella, closed, initial encounter 03/06/2017  . Diabetes mellitus without complication ([place])   . Medical history non-contributory   . Vitamin D deficiency 03/10/2017    Past Surgical History:  Procedure Laterality Date  . HIP CLOSED REDUCTION Right 03/05/2017   Procedure: CLOSED REDUCTION HIP;  Surgeon: Altamese Henrietta, MD;  Location: [facility];  Service: Orthopedics;  Laterality: Right;  . I&D EXTREMITY Right 03/05/2017   Procedure: IRRIGATION AND DEBRIDEMENT ANKLE AND LEG;  Surgeon: Altamese Maplewood, MD;  Location: [facility];  Service: Orthopedics;  Laterality: Right;  . ORIF ANKLE FRACTURE Right 03/09/2017   Procedure: OPEN REDUCTION INTERNAL FIXATION (ORIF) ANKLE FRACTURE;  Surgeon: Altamese Deep River, MD;  Location: [facility];  Service: Orthopedics;  Laterality: Right;  . ORIF PATELLA Right 03/09/2017   Procedure: OPEN REDUCTION INTERNAL (ORIF) FIXATION PATELLA;  Surgeon: Altamese [place] Creek, MD;  Location: [facility];  Service: Orthopedics;  Laterality: Right;  . ORIF PELVIC FRACTURE Right 03/09/2017   Procedure: OPEN TREATMENT HIP DISLOCATION;  Surgeon: Altamese Willard, MD;  Location: [facility];  Service: Orthopedics;  Laterality: Right;    Family History  Problem Relation Age of Onset  . Diabetes Father    Social History:  reports that he has never smoked. He has never used smokeless tobacco. He reports that he does not drink alcohol or use drugs.  Allergies:  Allergies  Allergen Reactions  . Penicillins Other (See Comments)    Testicles swell up    Medications Prior to Admission  Medication Sig Dispense Refill  . ascorbic acid  (VITAMIN C) 1000 MG tablet Take 1 tablet (1,000 mg total) by mouth daily.    . Cholecalciferol 2000 units TABS Take 1 tablet (2,000 Units total) by mouth 2 (two) times daily.    Marland Kitchen docusate sodium (COLACE) 100 MG capsule Take 1 capsule (100 mg total) by mouth 2 (two) times daily. 10 capsule 0  . insulin aspart (NOVOLOG) 100 UNIT/ML injection Inject 0-15 Units into the skin every 4 (four) hours. 10 mL 11  . insulin aspart (NOVOLOG) 100 UNIT/ML injection Inject 3 Units into the skin 3 (three) times daily with meals. 10 mL 11  . insulin detemir (LEVEMIR) 100 UNIT/ML injection Inject 0.15 mLs (15 Units total) into the skin 2 (two) times daily. 10 mL 11  . methocarbamol (ROBAXIN) 500 MG tablet Take 2 tablets (1,000 mg total) by mouth 4 (four) times daily.    Marland Kitchen nystatin (MYCOSTATIN/NYSTOP) powder Apply topically 2 (two) times daily. 15 g 0  . acetaminophen (TYLENOL) 325 MG tablet Take 2 tablets (650 mg total) by mouth every 6 (six) hours as needed for mild pain (or Fever >/= 101).    . feeding supplement, GLUCERNA SHAKE, (GLUCERNA SHAKE) LIQD Take 237 mLs by mouth 3 (three) times daily between meals.  0  . ondansetron (ZOFRAN) 4 MG tablet Take 1 tablet (4 mg total) by mouth every 6 (six) hours as needed for nausea. 20 tablet 0  . oxyCODONE-acetaminophen (PERCOCET/ROXICET) 5-325 MG tablet Take 1-2 tablets by mouth every 6 (six) hours as needed for moderate pain or severe pain. 30 tablet 0    Results for orders placed or  performed during the hospital encounter of 05/11/17 (from the past 48 hour(s))  Basic metabolic panel     Status: Abnormal   Collection Time: 05/11/17  9:42 AM  Result Value Ref Range   Sodium 135 135 - 145 mmol/L   Potassium 3.7 3.5 - 5.1 mmol/L   Chloride 99 (L) 101 - 111 mmol/L   CO2 23 22 - 32 mmol/L   Glucose, Bld 297 (H) 65 - 99 mg/dL   BUN 9 6 - 20 mg/dL   Creatinine, Ser 0.64 0.61 - 1.24 mg/dL   Calcium 9.5 8.9 - 10.3 mg/dL   GFR calc non Af Amer >60 >60 mL/min   GFR calc  Af Amer >60 >60 mL/min    Comment: (NOTE) The eGFR has been calculated using the CKD EPI equation. This calculation has not been validated in all clinical situations. eGFR's persistently <60 mL/min signify possible Chronic Kidney Disease.    Anion gap 13 5 - 15  CBC     Status: Abnormal   Collection Time: 05/11/17  9:42 AM  Result Value Ref Range   WBC 13.0 (H) 4.0 - 10.5 K/uL   RBC 4.76 4.22 - 5.81 MIL/uL   Hemoglobin 12.9 (L) 13.0 - 17.0 g/dL   HCT 39.7 39.0 - 52.0 %   MCV 83.4 78.0 - 100.0 fL   MCH 27.1 26.0 - 34.0 pg   MCHC 32.5 30.0 - 36.0 g/dL   RDW 14.3 11.5 - 15.5 %   Platelets 410 (H) 150 - 400 K/uL  Glucose, capillary     Status: Abnormal   Collection Time: 05/11/17  9:52 AM  Result Value Ref Range   Glucose-Capillary 279 (H) 65 - 99 mg/dL   Comment 1 Notify RN    Comment 2 Document in Chart   Glucose, capillary     Status: Abnormal   Collection Time: 05/11/17 12:07 PM  Result Value Ref Range   Glucose-Capillary 233 (H) 65 - 99 mg/dL   No results found.  ROS  Blood pressure (!) 157/96, pulse (!) 110, temperature 97.7 F (36.5 C), temperature source Oral, resp. rate 18, height '6\' 3"'  (1.905 m), weight 77.6 kg (171 lb), SpO2 100 %. Physical Exam Poor hygiene and dentition RRR No wheezing LLE No traumatic wounds, ecchymosis, or rash  Nontender  No effusions  Knee stable to varus/ valgus and anterior/posterior stress  Sens DPN, SPN, TN intact  Motor EHL, ext, flex, evers intact  DP 2+, PT 2+, No significant edema  Refuses movement but no discernible sign of injury RLE Healed knee incision  Tender, exposed hardware right med malleolus  Sens DPN, SPN, TN intact  Motor EHL, ext, flex, evers 5/5  DP 2+, PT 2+, No significant edema   Assessment/Plan Exposed hardware right medial malleolus, osteomyelitis  I discussed with the patient the risks and benefits of surgery, including the possibility of infection, nerve injury, vessel injury, wound breakdown,  arthritis, symptomatic hardware, DVT/ PE, loss of motion, and need for further surgery among others.  He acknowledged these risks and wished to proceed.  Altamese Blanchard, MD [facility] [phone] (p)   05/11/2017, 12:12 PM

## 2017-05-11 NOTE — Progress Notes (Signed)
Allergy to PCN noted spoke with Dr Carola FrostHandy states to do a test dose, and then give a total of 2 grams of Ancef.

## 2017-05-11 NOTE — Progress Notes (Signed)
Refuses to remove shorts explained the need to remove, states you can cut them off if you need.

## 2017-05-11 NOTE — Transfer of Care (Signed)
Immediate Anesthesia Transfer of Care Note  Patient: Walter Hansen  Procedure(s) Performed: Procedure(s): HARDWARE REMOVAL OF RIGHT ANKLE (Right)  Patient Location: PACU  Anesthesia Type:General  Level of Consciousness: awake, alert  and patient cooperative  Airway & Oxygen Therapy: Patient Spontanous Breathing and Patient connected to nasal cannula oxygen  Post-op Assessment: Report given to RN and Post -op Vital signs reviewed and stable  Post vital signs: Reviewed and stable  Last Vitals:  Vitals:   05/11/17 0952 05/11/17 1021  BP: (!) 158/104 (!) 157/96  Pulse: (!) 116 (!) 110  Resp: 18   Temp: 36.5 C     Last Pain:  Vitals:   05/11/17 1007  TempSrc:   PainSc: 5       Patients Stated Pain Goal: 2 (05/11/17 1007)  Complications: No apparent anesthesia complications

## 2017-05-11 NOTE — Anesthesia Procedure Notes (Cosign Needed)
Procedure Name: LMA Insertion Date/Time: 05/11/2017 12:26 PM Performed by: Orvilla FusATO, SARAH A Pre-anesthesia Checklist: Patient identified, Emergency Drugs available, Suction available, Patient being monitored and Timeout performed Patient Re-evaluated:Patient Re-evaluated prior to induction Oxygen Delivery Method: Circle system utilized Preoxygenation: Pre-oxygenation with 100% oxygen Induction Type: IV induction Ventilation: Mask ventilation without difficulty LMA: LMA inserted LMA Size: 4.0 Placement Confirmation: positive ETCO2 Tube secured with: Tape

## 2017-05-11 NOTE — Progress Notes (Signed)
Pharmacy Antibiotic Note  Walter Hansen is a 36 y.o. male admitted on 05/11/2017 with wound infection/left medial malleolus osteomyelitis. Patient had surgery today to remove the hardware in his right ankle. Pharmacy has been consulted for vancomycin dosing .Patient received cefazolin prior to surgery. WBC-13 and patient afebrile. Scr <1 with estimated CrCl > 100 ml/min.  Plan: Vancomycin 1250 mg X 1 Then  Vancomycin 1000 mg every 8 hours Monitor renal function closely  Vanc trough at steady state Follow cultures, length of therapy, ability to narrow   Height: 6\' 3"  (190.5 cm) Weight: 171 lb (77.6 kg) IBW/kg (Calculated) : 84.5  Temp (24hrs), Avg:98 F (36.7 C), Min:97.7 F (36.5 C), Max:98.5 F (36.9 C)   Recent Labs Lab 05/11/17 0942  WBC 13.0*  CREATININE 0.64    Estimated Creatinine Clearance: 141.5 mL/min (by C-G formula based on SCr of 0.64 mg/dL).    Allergies  Allergen Reactions  . Penicillins Other (See Comments)    Testicles swell up    Antimicrobials this admission: 7/24 Vanc>>   Thank you for allowing pharmacy to be a part of this patient's care.  Della GooEmily S Sinclair, PharmD PGY2 Infectious Diseases Pharmacy Resident Pager: [phone] 05/11/2017 4:06 PM

## 2017-05-11 NOTE — Brief Op Note (Signed)
05/11/2017  4:19 PM  PATIENT:  Walter Hansen  36 y.o. male  PRE-OPERATIVE DIAGNOSIS:  RIGHT ANKLE EXPOSED HARDWARE, OSTEOMYELITIS  POST-OPERATIVE DIAGNOSIS:  RIGHT ANKLE EXPOSED HARDWARE, OSTEOMYELITIS  PROCEDURE:  Procedure(s): 1. HARDWARE REMOVAL OF RIGHT ANKLE (Right)  2. PARTIAL EXCISION RIGHT TIBIA  3. APPLICATION OF SMALL WOUND VAC 4. MANIPULATION OF RIGHT KNEE  SURGEON:  Surgeon(s) and Role:    * Myrene GalasHandy, Toribio Seiber, MD - Primary  PHYSICIAN ASSISTANT: PA Student  ANESTHESIA:   general  EBL:  Total I/O In: 700 [I.V.:700] Out: 0   BLOOD ADMINISTERED:none  DRAINS: small wound vac  LOCAL MEDICATIONS USED:  NONE  SPECIMEN:  No Specimen  DISPOSITION OF SPECIMEN:  N/A  COUNTS:  YES  TOURNIQUET:    DICTATION: .Other Dictation: Dictation Number [id number]  PLAN OF CARE: Admit to inpatient   PATIENT DISPOSITION:  PACU - hemodynamically stable.   Delay start of Pharmacological VTE agent (>24hrs) due to surgical blood loss or risk of bleeding: no

## 2017-05-11 NOTE — Anesthesia Preprocedure Evaluation (Addendum)
Anesthesia Evaluation  Patient identified by MRN, date of birth, ID band Patient awake    Reviewed: Allergy & Precautions, H&P , NPO status , Patient's Chart, lab work & pertinent test results  Airway Mallampati: III  TM Distance: >3 FB Neck ROM: Full    Dental no notable dental hx. (+) Teeth Intact, Dental Advisory Given   Pulmonary neg pulmonary ROS,    Pulmonary exam normal breath sounds clear to auscultation       Cardiovascular negative cardio ROS   Rhythm:Regular Rate:Normal     Neuro/Psych negative neurological ROS  negative psych ROS   GI/Hepatic negative GI ROS, Neg liver ROS,   Endo/Other  diabetes, Type 1, Insulin Dependent  Renal/GU negative Renal ROS  negative genitourinary   Musculoskeletal   Abdominal   Peds  Hematology negative hematology ROS (+)   Anesthesia Other Findings   Reproductive/Obstetrics negative OB ROS                            Anesthesia Physical Anesthesia Plan  ASA: III  Anesthesia Plan: General   Post-op Pain Management:    Induction: Intravenous  PONV Risk Score and Plan: 3 and Ondansetron, Dexamethasone and Midazolam  Airway Management Planned: LMA  Additional Equipment:   Intra-op Plan:   Post-operative Plan: Extubation in OR  Informed Consent: I have reviewed the patients History and Physical, chart, labs and discussed the procedure including the risks, benefits and alternatives for the proposed anesthesia with the patient or authorized representative who has indicated his/her understanding and acceptance.   Dental advisory given  Plan Discussed with: CRNA  Anesthesia Plan Comments:         Anesthesia Quick Evaluation

## 2017-05-12 ENCOUNTER — Encounter (HOSPITAL_COMMUNITY): Payer: Self-pay | Admitting: Orthopedic Surgery

## 2017-05-12 DIAGNOSIS — E559 Vitamin D deficiency, unspecified: Secondary | ICD-10-CM | POA: Diagnosis present

## 2017-05-12 DIAGNOSIS — D62 Acute posthemorrhagic anemia: Secondary | ICD-10-CM | POA: Diagnosis not present

## 2017-05-12 DIAGNOSIS — S82891A Other fracture of right lower leg, initial encounter for closed fracture: Secondary | ICD-10-CM

## 2017-05-12 DIAGNOSIS — Z833 Family history of diabetes mellitus: Secondary | ICD-10-CM

## 2017-05-12 DIAGNOSIS — M24561 Contracture, right knee: Secondary | ICD-10-CM | POA: Diagnosis present

## 2017-05-12 DIAGNOSIS — Z79899 Other long term (current) drug therapy: Secondary | ICD-10-CM | POA: Diagnosis not present

## 2017-05-12 DIAGNOSIS — E1169 Type 2 diabetes mellitus with other specified complication: Secondary | ICD-10-CM | POA: Diagnosis present

## 2017-05-12 DIAGNOSIS — Y838 Other surgical procedures as the cause of abnormal reaction of the patient, or of later complication, without mention of misadventure at the time of the procedure: Secondary | ICD-10-CM | POA: Diagnosis present

## 2017-05-12 DIAGNOSIS — S8251XK Displaced fracture of medial malleolus of right tibia, subsequent encounter for closed fracture with nonunion: Secondary | ICD-10-CM | POA: Diagnosis not present

## 2017-05-12 DIAGNOSIS — T8489XA Other specified complication of internal orthopedic prosthetic devices, implants and grafts, initial encounter: Secondary | ICD-10-CM | POA: Diagnosis present

## 2017-05-12 DIAGNOSIS — M869 Osteomyelitis, unspecified: Secondary | ICD-10-CM | POA: Diagnosis present

## 2017-05-12 DIAGNOSIS — Z794 Long term (current) use of insulin: Secondary | ICD-10-CM | POA: Diagnosis not present

## 2017-05-12 DIAGNOSIS — Z880 Allergy status to penicillin: Secondary | ICD-10-CM | POA: Diagnosis not present

## 2017-05-12 LAB — BASIC METABOLIC PANEL
Anion gap: 8 (ref 5–15)
BUN: <5 mg/dL — ABNORMAL LOW (ref 6–20)
CALCIUM: 9.3 mg/dL (ref 8.9–10.3)
CO2: 26 mmol/L (ref 22–32)
CREATININE: 0.39 mg/dL — AB (ref 0.61–1.24)
Chloride: 101 mmol/L (ref 101–111)
Glucose, Bld: 157 mg/dL — ABNORMAL HIGH (ref 65–99)
Potassium: 4 mmol/L (ref 3.5–5.1)
SODIUM: 135 mmol/L (ref 135–145)

## 2017-05-12 LAB — GLUCOSE, CAPILLARY
GLUCOSE-CAPILLARY: 195 mg/dL — AB (ref 65–99)
GLUCOSE-CAPILLARY: 152 mg/dL — AB (ref 65–99)
Glucose-Capillary: 162 mg/dL — ABNORMAL HIGH (ref 65–99)
Glucose-Capillary: 169 mg/dL — ABNORMAL HIGH (ref 65–99)

## 2017-05-12 LAB — HEMOGLOBIN A1C
Hgb A1c MFr Bld: 12.9 % — ABNORMAL HIGH (ref 4.8–5.6)
MEAN PLASMA GLUCOSE: 324 mg/dL

## 2017-05-12 MED ORDER — CHLORHEXIDINE GLUCONATE 4 % EX LIQD
60.0000 mL | Freq: Once | CUTANEOUS | Status: AC
  Administered 2017-05-13: 4 via TOPICAL
  Filled 2017-05-12: qty 60

## 2017-05-12 NOTE — Progress Notes (Signed)
Orthopaedic Trauma Service (OTS)  1 Day Post-Op Procedure(s) (LRB): HARDWARE REMOVAL OF RIGHT ANKLE (Right)  Subjective: Patient reports pain as mild.    Objective: Current Vitals Blood pressure 130/77, pulse 100, temperature 97.9 F (36.6 C), temperature source Oral, resp. rate 16, height 6\' 3"  (1.905 m), weight 77.6 kg (171 lb), SpO2 98 %. Vital signs in last 24 hours: Temp:  [97.7 F (36.5 C)-98.9 F (37.2 C)] 97.9 F (36.6 C) (07/25 16100652) Pulse Rate:  [100-118] 100 (07/25 0652) Resp:  [16-23] 16 (07/25 0652) BP: (121-166)/(77-104) 130/77 (07/25 0652) SpO2:  [97 %-100 %] 98 % (07/25 0652) Weight:  [77.6 kg (171 lb)] 77.6 kg (171 lb) (07/24 1007)  Intake/Output from previous day: 07/24 0701 - 07/25 0700 In: 1136 [P.O.:236; I.V.:900] Out: 990 [Urine:950; Drains:40]  LABS  Recent Labs  05/11/17 0942 05/11/17 1700  HGB 12.9* 12.2*    Recent Labs  05/11/17 0942 05/11/17 1700  WBC 13.0* 13.9*  RBC 4.76 4.46  HCT 39.7 37.1*  PLT 410* 342    Recent Labs  05/11/17 0942 05/11/17 1700 05/12/17 0526  NA 135  --  135  K 3.7  --  4.0  CL 99*  --  101  CO2 23  --  26  BUN 9  --  <5*  CREATININE 0.64 0.52* 0.39*  GLUCOSE 297*  --  157*  CALCIUM 9.5  --  9.3   No results for input(s): LABPT, INR in the last 72 hours.   Physical Exam RLE Dressing intact, clean, dry  Edema/ swelling controlled  Sens: DPN, SPN, TN intact  Motor: EHL, FHL, and lessor toe ext and flex all intact grossly  Brisk cap refill, warm to touch  Assessment/Plan: 1 Day Post-Op Procedure(s) (LRB): HARDWARE REMOVAL OF RIGHT ANKLE (Right) 1. PT/OT WBAT bilaterally lower MUST GET UP AND MOVE WITH PT; PATIENT CONTINUES TO INSIST ON STAYING IN BED AND USING A DIAPER DESPITE NO LEFT LOWER EXTREM INJURIES AND A HEALED RIGHT PATELLA AND ANKLE 2. DVT proph Lovenox 3. OR tomorrow with Dr. Ulice Hansen and me 4. ID consult today  Walter GalasMichael Basem Yannuzzi, MD [facility] [phone] (p)

## 2017-05-12 NOTE — Evaluation (Signed)
Occupational Therapy Evaluation Patient Details Name: Daiva EvesWilliam Michael Frank MRN: [id number] DOB: 09/13/1981 Today's Date: 05/12/2017    History of Present Illness Pt is a 36 y.o. male s/p removal of R ankle hardware, partial excision R tibia, wound vac application, and manipulation R knee. PMHx: R hip dislocation, R ankle fx, R patellar fx, DM.   Clinical Impression   Pt reports he was requiring assist with all ADL PTA and was performing transfers to w/c via hoyer lift. Currently pt requires mod-max assist for bed mobility and max assist for ADL. Pt attempted to sit EOB but unable to achieve full sitting position before insisting he lay back down. Educated pt on importance of EOB and OOB activity; he continuously states that he wishes he could wait until everything heals. Recommend return to SNF for continued rehab to maximize independence and safety with ADL and functional mobility. Pt would benefit from continued skilled OT to address established goals.    Follow Up Recommendations  SNF;Supervision/Assistance - 24 hour    Equipment Recommendations  Other (comment) (TBD at next venue)    Recommendations for Other Services PT consult     Precautions / Restrictions Precautions Precautions: Fall Restrictions Weight Bearing Restrictions: Yes RLE Weight Bearing: Weight bearing as tolerated      Mobility Bed Mobility Overal bed mobility: Needs Assistance Bed Mobility: Supine to Sit;Sit to Supine     Supine to sit: Mod assist Sit to supine: Max assist   General bed mobility comments: Assist for RLE to EOB and to scoot hips with use of bed pad. Pt attempting to push up to sitting with UB but unsuccessful and would not let therapist assist with getting to full sitting position-requesting to lay back down. Max assist to return to bed and for repositioning  Transfers                      Balance                                           ADL either  performed or assessed with clinical judgement   ADL Overall ADL's : Needs assistance/impaired Eating/Feeding: Set up;Bed level Eating/Feeding Details (indicate cue type and reason): Pt observed to be self feeding at start of session Grooming: Minimal assistance;Bed level                                 General ADL Comments: Pt requires max-total assist for ADL at this time.     Vision         Perception     Praxis      Pertinent Vitals/Pain Pain Assessment: Faces Faces Pain Scale: Hurts whole lot Pain Location: RLE Pain Descriptors / Indicators: Grimacing;Guarding;Moaning Pain Intervention(s): Monitored during session;Limited activity within patient's tolerance     Hand Dominance Right   Extremity/Trunk Assessment Upper Extremity Assessment Upper Extremity Assessment: LUE deficits/detail LUE Deficits / Details: L wrist splint donned. Limited AROM at shoulder. LUE: Unable to fully assess due to immobilization   Lower Extremity Assessment Lower Extremity Assessment: Defer to PT evaluation       Communication Communication Communication: No difficulties   Cognition Arousal/Alertness: Awake/alert Behavior During Therapy: Anxious;Flat affect Overall Cognitive Status: No family/caregiver present to determine baseline cognitive functioning  General Comments       Exercises     Shoulder Instructions      Home Living Family/patient expects to be discharged to:: Skilled nursing facility                                        Prior Functioning/Environment Level of Independence: Needs assistance  Gait / Transfers Assistance Needed: hoyer lift transfers to w/c ADL's / Homemaking Assistance Needed: SNF staff assists with bathing and dressing at bed level            OT Problem List: Decreased strength;Decreased range of motion;Impaired balance (sitting and/or standing);Decreased  cognition;Decreased safety awareness;Decreased knowledge of use of DME or AE;Decreased knowledge of precautions;Impaired UE functional use;Pain;Increased edema      OT Treatment/Interventions: Self-care/ADL training;Therapeutic exercise;Energy conservation;DME and/or AE instruction;Therapeutic activities;Patient/family education;Balance training;Cognitive remediation/compensation    OT Goals(Current goals can be found in the care plan section) Acute Rehab OT Goals Patient Stated Goal: get everything healed up OT Goal Formulation: With patient Time For Goal Achievement: 05/26/17 Potential to Achieve Goals: Fair ADL Goals Pt Will Transfer to Toilet: with +2 assist;with max assist;stand pivot transfer;bedside commode Additional ADL Goal #1: Pt will perform bed mobility with min assist as precursor to ADL. Additional ADL Goal #2: Pt will sit EOB x10 minutes with min guard assist as precursor to ADL.  OT Frequency: Min 2X/week   Barriers to D/C:            Co-evaluation              AM-PAC PT "6 Clicks" Daily Activity     Outcome Measure Help from another person eating meals?: A Little Help from another person taking care of personal grooming?: A Little Help from another person toileting, which includes using toliet, bedpan, or urinal?: Total Help from another person bathing (including washing, rinsing, drying)?: Total Help from another person to put on and taking off regular upper body clothing?: Total Help from another person to put on and taking off regular lower body clothing?: Total 6 Click Score: 10   End of Session    Activity Tolerance: Patient limited by pain Patient left: in bed;with call bell/phone within reach;with bed alarm set;with SCD's reapplied  OT Visit Diagnosis: Other abnormalities of gait and mobility (R26.89);Pain Pain - Right/Left: Right Pain - part of body: Leg                Time: [id number] OT Time Calculation (min): 19 min Charges:  OT General  Charges $OT Visit: 1 Procedure OT Evaluation $OT Eval Moderate Complexity: 1 Procedure G-Codes: OT G-codes **NOT FOR INPATIENT CLASS** Functional Assessment Tool Used: Clinical judgement Functional Limitation: Self care Self Care Current Status ([id number]): At least 80 percent but less than 100 percent impaired, limited or restricted Self Care Goal Status ([id number]): At least 60 percent but less than 80 percent impaired, limited or restricted   Fredric MareBailey A. Brett Albinooffey, M.S., OTR/L Pager: [phone]  Gaye AlkenBailey A Tandy Lewin 05/12/2017, 9:10 AM

## 2017-05-12 NOTE — Anesthesia Preprocedure Evaluation (Addendum)
Anesthesia Evaluation  Patient identified by MRN, date of birth, ID band Patient awake    Reviewed: Allergy & Precautions, H&P , NPO status , Patient's Chart, lab work & pertinent test results  Airway Mallampati: III  TM Distance: >3 FB Neck ROM: Full    Dental no notable dental hx. (+) Teeth Intact, Dental Advisory Given   Pulmonary neg pulmonary ROS,    Pulmonary exam normal breath sounds clear to auscultation       Cardiovascular negative cardio ROS   Rhythm:Regular Rate:Normal     Neuro/Psych negative neurological ROS  negative psych ROS   GI/Hepatic negative GI ROS, Neg liver ROS,   Endo/Other  diabetes, Type 1, Insulin Dependent  Renal/GU negative Renal ROS  negative genitourinary   Musculoskeletal   Abdominal Normal abdominal exam  (+)   Peds  Hematology  (+) Blood dyscrasia, anemia ,   Anesthesia Other Findings   Reproductive/Obstetrics negative OB ROS                            Anesthesia Physical  Anesthesia Plan  ASA: II  Anesthesia Plan: General   Post-op Pain Management:    Induction: Intravenous  PONV Risk Score and Plan: 3 and Ondansetron, Dexamethasone and Midazolam  Airway Management Planned: LMA  Additional Equipment:   Intra-op Plan:   Post-operative Plan: Extubation in OR  Informed Consent: I have reviewed the patients History and Physical, chart, labs and discussed the procedure including the risks, benefits and alternatives for the proposed anesthesia with the patient or authorized representative who has indicated his/her understanding and acceptance.   Dental advisory given  Plan Discussed with: CRNA and Surgeon  Anesthesia Plan Comments:        Anesthesia Quick Evaluation
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
## 2017-05-12 NOTE — Progress Notes (Signed)
[facility]  [facility] Coordinator will follow pt for possible home IV ABX at DC.  If patient discharges after hours, please call [phone].   Sedalia Mutaamela S Chandler 05/12/2017, 12:21 PM

## 2017-05-13 ENCOUNTER — Encounter (HOSPITAL_COMMUNITY): Payer: Self-pay | Admitting: *Deleted

## 2017-05-13 ENCOUNTER — Inpatient Hospital Stay (HOSPITAL_COMMUNITY): Payer: BLUE CROSS/BLUE SHIELD | Admitting: Anesthesiology

## 2017-05-13 ENCOUNTER — Encounter (HOSPITAL_COMMUNITY)
Admission: RE | Disposition: A | Discharge status: Skilled Nursing Facility | Payer: Self-pay | Source: Home / Self Care | Attending: Orthopedic Surgery

## 2017-05-13 HISTORY — PX: APPLICATION OF WOUND VAC: SHX5189

## 2017-05-13 LAB — GLUCOSE, CAPILLARY
GLUCOSE-CAPILLARY: 121 mg/dL — AB (ref 65–99)
GLUCOSE-CAPILLARY: 148 mg/dL — AB (ref 65–99)
Glucose-Capillary: 122 mg/dL — ABNORMAL HIGH (ref 65–99)

## 2017-05-13 LAB — SEDIMENTATION RATE: Sed Rate: 105 mm/hr — ABNORMAL HIGH (ref 0–16)

## 2017-05-13 LAB — C-REACTIVE PROTEIN: CRP: 10.7 mg/dL — AB (ref <1.0)

## 2017-05-13 LAB — SURGICAL PCR SCREEN
MRSA, PCR: NEGATIVE
Staphylococcus aureus: NEGATIVE

## 2017-05-13 SURGERY — APPLICATION, WOUND VAC
Anesthesia: General | Laterality: Right

## 2017-05-13 MED ORDER — VANCOMYCIN HCL IN DEXTROSE 1-5 GM/200ML-% IV SOLN
1000.0000 mg | Freq: Three times a day (TID) | INTRAVENOUS | Status: DC
  Administered 2017-05-13 – 2017-05-14 (×4): 1000 mg via INTRAVENOUS
  Filled 2017-05-13 (×6): qty 200

## 2017-05-13 MED ORDER — SODIUM CHLORIDE 0.9 % IJ SOLN
INTRAMUSCULAR | Status: DC | PRN
  Administered 2017-05-13: 09:00:00

## 2017-05-13 MED ORDER — MEPERIDINE HCL 25 MG/ML IJ SOLN: 6.2500 mg | INTRAMUSCULAR | Status: DC | PRN

## 2017-05-13 MED ORDER — FENTANYL CITRATE (PF) 100 MCG/2ML IJ SOLN
INTRAMUSCULAR | Status: DC | PRN
  Administered 2017-05-13: 100 ug via INTRAVENOUS
  Administered 2017-05-13: 50 ug via INTRAVENOUS

## 2017-05-13 MED ORDER — PROPOFOL 10 MG/ML IV BOLUS
INTRAVENOUS | Status: DC | PRN
  Administered 2017-05-13: 200 mg via INTRAVENOUS
  Administered 2017-05-13: 20 mg via INTRAVENOUS

## 2017-05-13 MED ORDER — ONDANSETRON HCL 4 MG/2ML IJ SOLN
INTRAMUSCULAR | Status: DC | PRN
  Administered 2017-05-13: 4 mg via INTRAVENOUS

## 2017-05-13 MED ORDER — OXYCODONE HCL 5 MG PO TABS
ORAL_TABLET | ORAL | Status: AC
  Filled 2017-05-13: qty 2

## 2017-05-13 MED ORDER — FENTANYL CITRATE (PF) 250 MCG/5ML IJ SOLN
INTRAMUSCULAR | Status: AC
  Filled 2017-05-13: qty 5

## 2017-05-13 MED ORDER — HYDROMORPHONE HCL 1 MG/ML IJ SOLN
INTRAMUSCULAR | Status: AC
  Administered 2017-05-13: 0.5 mg via INTRAVENOUS
  Filled 2017-05-13: qty 1

## 2017-05-13 MED ORDER — KETOROLAC TROMETHAMINE 30 MG/ML IJ SOLN
30.0000 mg | Freq: Once | INTRAMUSCULAR | Status: DC | PRN
  Administered 2017-05-13: 30 mg via INTRAVENOUS

## 2017-05-13 MED ORDER — PROMETHAZINE HCL 25 MG/ML IJ SOLN: 6.2500 mg | INTRAMUSCULAR | Status: DC | PRN

## 2017-05-13 MED ORDER — MIDAZOLAM HCL 5 MG/5ML IJ SOLN
INTRAMUSCULAR | Status: DC | PRN
  Administered 2017-05-13: 2 mg via INTRAVENOUS

## 2017-05-13 MED ORDER — PROPOFOL 10 MG/ML IV BOLUS
INTRAVENOUS | Status: AC
  Filled 2017-05-13: qty 40

## 2017-05-13 MED ORDER — HYDROMORPHONE HCL 1 MG/ML IJ SOLN
0.2500 mg | INTRAMUSCULAR | Status: DC | PRN
  Administered 2017-05-13 (×2): 0.5 mg via INTRAVENOUS

## 2017-05-13 MED ORDER — MIDAZOLAM HCL 2 MG/2ML IJ SOLN
INTRAMUSCULAR | Status: AC
  Filled 2017-05-13: qty 2

## 2017-05-13 MED ORDER — KETOROLAC TROMETHAMINE 30 MG/ML IJ SOLN
INTRAMUSCULAR | Status: AC
  Filled 2017-05-13: qty 1

## 2017-05-13 MED ORDER — LACTATED RINGERS IV SOLN
INTRAVENOUS | Status: DC | PRN
  Administered 2017-05-13: 07:00:00 via INTRAVENOUS

## 2017-05-13 MED ORDER — SODIUM CHLORIDE 0.9 % IR SOLN
Status: DC | PRN
  Administered 2017-05-13: 1000 mL

## 2017-05-13 MED ORDER — LIDOCAINE HCL (CARDIAC) 20 MG/ML IV SOLN
INTRAVENOUS | Status: DC | PRN
  Administered 2017-05-13: 60 mg via INTRAVENOUS

## 2017-05-13 SURGICAL SUPPLY — 47 items
BANDAGE ACE 4X5 VEL STRL LF (GAUZE/BANDAGES/DRESSINGS) IMPLANT
BANDAGE ACE 6X5 VEL STRL LF (GAUZE/BANDAGES/DRESSINGS) ×2 IMPLANT
BNDG GAUZE ELAST 4 BULKY (GAUZE/BANDAGES/DRESSINGS) ×4 IMPLANT
CANISTER SUCT 3000ML PPV (MISCELLANEOUS) ×3 IMPLANT
CANISTER WOUND CARE 500ML ATS (WOUND CARE) ×3 IMPLANT
CONT SPEC 4OZ CLIKSEAL STRL BL (MISCELLANEOUS) ×4 IMPLANT
COVER SURGICAL LIGHT HANDLE (MISCELLANEOUS) ×3 IMPLANT
DRAPE HALF SHEET 40X57 (DRAPES) IMPLANT
DRAPE INCISE IOBAN 66X45 STRL (DRAPES) IMPLANT
DRAPE ORTHO SPLIT 77X108 STRL (DRAPES)
DRAPE SURG ORHT 6 SPLT 77X108 (DRAPES) IMPLANT
DRESSING HYDROCOLLOID 4X4 XTH (GAUZE/BANDAGES/DRESSINGS) ×2 IMPLANT
DRSG CUTIMED SORBACT 7X9 (GAUZE/BANDAGES/DRESSINGS) ×2 IMPLANT
DRSG PAD ABDOMINAL 8X10 ST (GAUZE/BANDAGES/DRESSINGS) IMPLANT
DRSG VAC ATS LRG SENSATRAC (GAUZE/BANDAGES/DRESSINGS) IMPLANT
DRSG VAC ATS MED SENSATRAC (GAUZE/BANDAGES/DRESSINGS) IMPLANT
DRSG VAC ATS SM SENSATRAC (GAUZE/BANDAGES/DRESSINGS) ×2 IMPLANT
ELECT REM PT RETURN 9FT ADLT (ELECTROSURGICAL) ×3
ELECTRODE REM PT RTRN 9FT ADLT (ELECTROSURGICAL) ×1 IMPLANT
GAUZE SPONGE 4X4 12PLY STRL (GAUZE/BANDAGES/DRESSINGS) IMPLANT
GAUZE XEROFORM 5X9 LF (GAUZE/BANDAGES/DRESSINGS) ×1 IMPLANT
GLOVE BIO SURGEON STRL SZ7.5 (GLOVE) ×3 IMPLANT
GLOVE BIO SURGEON STRL SZ8 (GLOVE) ×3 IMPLANT
GLOVE BIOGEL PI IND STRL 8 (GLOVE) ×2 IMPLANT
GLOVE BIOGEL PI INDICATOR 8 (GLOVE) ×4
GOWN STRL REUS W/ TWL LRG LVL3 (GOWN DISPOSABLE) ×1 IMPLANT
GOWN STRL REUS W/ TWL XL LVL3 (GOWN DISPOSABLE) ×1 IMPLANT
GOWN STRL REUS W/TWL LRG LVL3 (GOWN DISPOSABLE) ×3
GOWN STRL REUS W/TWL XL LVL3 (GOWN DISPOSABLE) ×3
KIT BASIN OR (CUSTOM PROCEDURE TRAY) ×3 IMPLANT
KIT ROOM TURNOVER OR (KITS) ×3 IMPLANT
MATRIX WOUND 3-LAYER 7X10 (Tissue) ×1 IMPLANT
MICROMATRIX 1000MG (Tissue) ×6 IMPLANT
NS IRRIG 1000ML POUR BTL (IV SOLUTION) ×3 IMPLANT
PACK ORTHO EXTREMITY (CUSTOM PROCEDURE TRAY) ×3 IMPLANT
PAD ARMBOARD 7.5X6 YLW CONV (MISCELLANEOUS) ×1 IMPLANT
SOLUTION PARTIC MCRMTRX 1000MG (Tissue) IMPLANT
SUT MNCRL AB 3-0 PS2 18 (SUTURE) ×4 IMPLANT
SUT SILK 3 0 SH 30 (SUTURE) ×2 IMPLANT
SUT VIC AB 5-0 PS2 18 (SUTURE) ×4 IMPLANT
TOWEL OR 17X24 6PK STRL BLUE (TOWEL DISPOSABLE) ×1 IMPLANT
TOWEL OR 17X26 10 PK STRL BLUE (TOWEL DISPOSABLE) ×3 IMPLANT
TUBE CONNECTING 12'X1/4 (SUCTIONS)
TUBE CONNECTING 12X1/4 (SUCTIONS) ×1 IMPLANT
WND VAC CANISTER 500ML (MISCELLANEOUS) ×2 IMPLANT
WOUND MATRIX 3-LAYER 7X10 (Tissue) ×1 IMPLANT
YANKAUER SUCT BULB TIP NO VENT (SUCTIONS) ×3 IMPLANT

## 2017-05-13 NOTE — Anesthesia Procedure Notes (Signed)
Procedure Name: LMA Insertion Date/Time: 05/13/2017 8:14 AM Performed by: Fransisca KaufmannMEYER, Kianna Billet E Pre-anesthesia Checklist: Patient identified, Emergency Drugs available, Suction available and Patient being monitored Patient Re-evaluated:Patient Re-evaluated prior to induction Oxygen Delivery Method: Circle System Utilized Preoxygenation: Pre-oxygenation with 100% oxygen Induction Type: IV induction Ventilation: Mask ventilation without difficulty LMA: LMA inserted LMA Size: 4.0 Number of attempts: 1 Placement Confirmation: positive ETCO2 Tube secured with: Tape Dental Injury: Teeth and Oropharynx as per pre-operative assessment

## 2017-05-13 NOTE — Social Work (Signed)
CSW spoke with Chantal in admissions [facility] to provide update on patient DC. CSW advised that patient had second procedure today. Pt was not seen by PT. CSW will continue to follow and keep her posted on DC.  Keene BreathPatricia Adelena Desantiago, LCSW Clinical Social Worker [phone]
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
## 2017-05-13 NOTE — Progress Notes (Signed)
PT Cancellation Note  Patient Details Name: Walter EvesWilliam Michael Horney MRN: [id number] DOB: 03/12/1981   Cancelled Treatment:    Reason Eval/Treat Not Completed: Patient declined, no reason specified Pt declined mobility. PT will continue to follow acutely.    Derek MoundKellyn R Raymondo Garcialopez Hedy Garro, PTA Pager: [phone]   05/13/2017, 2:38 PM
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
## 2017-05-13 NOTE — Brief Op Note (Signed)
05/11/2017 - 05/13/2017  9:10 AM  PATIENT:  Walter Hansen  36 y.o. male  PRE-OPERATIVE DIAGNOSIS:  Right Ankle Osteomyelitis with open wound  POST-OPERATIVE DIAGNOSIS:   1. Right Ankle Osteomyelitis with open wound 2. Right medial malleolus nonunion  PROCEDURE:  Procedure(s): 1. PARTIAL EXCISION OSTEOMYELITIS RIGHT TIBIA  2. ARTHROTOMY AND DRAINAGE OF RIGHT ANKLE JOINT 3. PREPARATION OF WOUND 5CM X CM 4. APPLICATION OF 1GM ACELL AND 5 X 5cm ACELL SHEET 5. PARTIAL CLOSURE WITH MATTRESS SUTURES 2CM 6. APPLICATION OF WOUND VAC RIGHT TIBIA Lynnda Child/ANKLE (Right)   SURGEON:  Surgeon(s) and Role:    * Myrene GalasHandy, Lorine Iannaccone, MD - Primary    * Dillingham, Alena Billslaire S, DO - Assisting  PHYSICIAN ASSISTANT: Shawn Rayburn, PA-C  ANESTHESIA:   general  EBL:  Total I/O In: 800 [I.V.:800] Out: 25 [Blood:25]  BLOOD ADMINISTERED:none  DRAINS: none   LOCAL MEDICATIONS USED:  NONE  SPECIMEN:  Source of Specimen:  medial malleolus nonunion  DISPOSITION OF SPECIMEN:  micro  COUNTS:  YES  TOURNIQUET:  * No tourniquets in log *  DICTATION: .Other Dictation: Dictation Number [id number]  PLAN OF CARE: Admit to inpatient   PATIENT DISPOSITION:  PACU - hemodynamically stable.   Delay start of Pharmacological VTE agent (>24hrs) due to surgical blood loss or risk of bleeding: no

## 2017-05-13 NOTE — Progress Notes (Signed)
Enjoyed getting up with therapists.  I discussed with the patient the risks and benefits of surgery, including the possibility of infection, nerve injury, vessel injury, wound breakdown, arthritis, DVT/ PE, loss of motion, and need for further surgery among others.  He acknowledged these risks and wished to proceed.  Myrene GalasMichael Kathlyn Leachman, MD [facility] [phone] (p)

## 2017-05-13 NOTE — Anesthesia Postprocedure Evaluation (Signed)
Anesthesia Post Note  Patient: Walter EvesWilliam Michael Hansen  Procedure(s) Performed: Procedure(s) (LRB): APPLICATION OF WOUND VAC- PREVENA RIGHT TIBIA/ANKLE (Right)     Patient location during evaluation: PACU Anesthesia Type: General Level of consciousness: awake Pain management: pain level controlled Vital Signs Assessment: post-procedure vital signs reviewed and stable Respiratory status: spontaneous breathing Cardiovascular status: stable Postop Assessment: no signs of nausea or vomiting and adequate PO intake Anesthetic complications: no    Last Vitals:  Vitals:   05/13/17 0930 05/13/17 0945  BP: (!) 141/90 (!) 143/90  Pulse: 86 84  Resp: 12 11  Temp:      Last Pain:  Vitals:   05/12/17 1430  TempSrc: Oral  PainSc:    Pain Goal: Patients Stated Pain Goal: 2 (05/12/17 0800)               Merial Moritz JR,JOHN Susann GivensFRANKLIN

## 2017-05-13 NOTE — Progress Notes (Signed)
    [facility]   Reason for visit: Follow up on osteomyelitis  Interval History: went to the OR today and OP report reviewed.  He does have non union of the right medial malleolus, joint effusion and concern for osteomyelitis and cultures sent and gram stain with GPC in pairs.  There were some WBCs.   No fever, no chills.  No associated n/v/d.   Vancomycin day 3  Physical Exam: Constitutional:  Vitals:   05/13/17 1015 05/13/17 1048  BP:  129/84  Pulse: 76 83  Resp: 16 16  Temp: 98 F (36.7 C) 97.7 F (36.5 C)   patient appears in NAD Eyes: anicteric Respiratory: Normal respiratory effort; CTA B Cardiovascular: RRR GI: soft, nt, nd  Review of Systems: Constitutional: negative for fevers, chills and malaise Gastrointestinal: negative for nausea and diarrhea Integument/breast: negative for rash  Lab Results  Component Value Date   WBC 13.9 (H) 05/11/2017   HGB 12.2 (L) 05/11/2017   HCT 37.1 (L) 05/11/2017   MCV 83.2 05/11/2017   PLT 342 05/11/2017    Lab Results  Component Value Date   CREATININE 0.39 (L) 05/12/2017   BUN <5 (L) 05/12/2017   NA 135 05/12/2017   K 4.0 05/12/2017   CL 101 05/12/2017   CO2 26 05/12/2017    Lab Results  Component Value Date   ALT 55 03/09/2017   AST 25 03/09/2017   ALKPHOS 64 03/09/2017     Microbiology: Recent Results (from the past 240 hour(s))  Surgical pcr screen     Status: None   Collection Time: 05/12/17  9:46 PM  Result Value Ref Range Status   MRSA, PCR NEGATIVE NEGATIVE Final   Staphylococcus aureus NEGATIVE NEGATIVE Final    Comment:        The Xpert SA Assay (FDA approved for NASAL specimens in patients over 36 years of age), is one component of a comprehensive surveillance program.  Test performance has been validated by [facility] for patients greater than or equal to 745 year old. It is not intended to diagnose infection nor to guide or monitor treatment.   Aerobic Culture  (superficial specimen)     Status: None (Preliminary result)   Collection Time: 05/13/17  8:42 AM  Result Value Ref Range Status   Specimen Description WOUND RIGHT ANKLE  Final   Special Requests NONE  Final   Gram Stain   Final    FEW WBC PRESENT, PREDOMINANTLY PMN RARE GRAM POSITIVE COCCI IN PAIRS    Culture PENDING  Incomplete   Report Status PENDING  Incomplete    Impression/Plan:  1. Osteomyelitis - Report reviewed and gram stain with GPC c/w osteomyelitis.  He will need prolonged IV antibiotics and will use vancomycin and narrow as appropriate for any growth.   Vancomycin for 6 weeks through September 5 I will order picc line Will continue to monitor cultures  2.  Access - as above, I will order picc line.

## 2017-05-13 NOTE — Transfer of Care (Signed)
Immediate Anesthesia Transfer of Care Note  Patient: Walter Hansen  Procedure(s) Performed: Procedure(s): APPLICATION OF WOUND VAC- PREVENA RIGHT TIBIA/ANKLE (Right)  Patient Location: PACU  Anesthesia Type:General  Level of Consciousness: awake, alert , oriented and sedated  Airway & Oxygen Therapy: Patient Spontanous Breathing and Patient connected to nasal cannula oxygen  Post-op Assessment: Report given to RN, Post -op Vital signs reviewed and stable and Patient moving all extremities  Post vital signs: Reviewed and stable  Last Vitals:  Vitals:   05/13/17 0605 05/13/17 0918  BP: 133/85   Pulse: 92   Resp: 16 17  Temp:  36.6 C    Last Pain:  Vitals:   05/12/17 1430  TempSrc: Oral  PainSc:       Patients Stated Pain Goal: 2 (05/12/17 0800)  Complications: No apparent anesthesia complications

## 2017-05-14 ENCOUNTER — Encounter (HOSPITAL_COMMUNITY): Payer: Self-pay | Admitting: Orthopedic Surgery

## 2017-05-14 LAB — CREATININE, SERUM
CREATININE: 0.56 mg/dL — AB (ref 0.61–1.24)
GFR calc Af Amer: >60 mL/min (ref >60)

## 2017-05-14 LAB — GLUCOSE, CAPILLARY
GLUCOSE-CAPILLARY: 123 mg/dL — AB (ref 65–99)
Glucose-Capillary: 110 mg/dL — ABNORMAL HIGH (ref 65–99)
Glucose-Capillary: 176 mg/dL — ABNORMAL HIGH (ref 65–99)

## 2017-05-14 LAB — VANCOMYCIN, TROUGH: Vancomycin Tr: 20 ug/mL (ref 15–20)

## 2017-05-14 MED ORDER — SODIUM CHLORIDE 0.9% FLUSH: 10.0000 mL | INTRAVENOUS | Status: DC | PRN

## 2017-05-14 MED ORDER — VANCOMYCIN HCL IN DEXTROSE 1-5 GM/200ML-% IV SOLN: 1000.0000 mg | Freq: Three times a day (TID) | INTRAVENOUS | 0 refills | Status: DC

## 2017-05-14 MED ORDER — HEPARIN SOD (PORK) LOCK FLUSH 100 UNIT/ML IV SOLN
250.0000 [IU] | INTRAVENOUS | Status: AC | PRN
  Administered 2017-05-14: 250 [IU]

## 2017-05-14 MED ORDER — DOCUSATE SODIUM 100 MG PO CAPS: 100.0000 mg | ORAL_CAPSULE | Freq: Two times a day (BID) | ORAL | 0 refills | Status: AC

## 2017-05-14 MED ORDER — VANCOMYCIN IV (FOR PTA / DISCHARGE USE ONLY): 1000.0000 mg | Freq: Three times a day (TID) | INTRAVENOUS | 0 refills | Status: AC

## 2017-05-14 NOTE — Care Management Note (Signed)
Case Management Note  Patient Details  Name: Walter EvesWilliam Michael Hansen MRN: [id number] Date of Birth: 01/31/1981  Subjective/Objective:    Right ankle partial excision, and hardware removal                Action/Plan: Discharge Planning: Chart reviewed. CSW following for SNF placement. Scheduled dc today to SNF.    Expected Discharge Date:  05/14/17               Expected Discharge Plan:  Skilled Nursing Facility  In-House Referral:     Discharge planning Services  CM Consult  Post Acute Care Choice:  NA Choice offered to:  NA  DME Arranged:  N/A DME Agency:  NA  HH Arranged:  NA HH Agency:  NA  Status of Service:  Completed, signed off  If discussed at Long Length of Stay Meetings, dates discussed:    Additional Comments:  [name], [name], RN 05/14/2017, 10:30 AM

## 2017-05-14 NOTE — Progress Notes (Signed)
[facility] Progress Note  Subjective  R ankle is feeling better today Slept well No numbness or tingling in the foot Pain is well managed with medications Reports no other pain other than Right ankle No abdominal pain  Review of Systems  Constitutional: Negative for chills and fever.  Cardiovascular: Negative for chest pain and palpitations.  Gastrointestinal: Negative for abdominal pain, nausea and vomiting.  Musculoskeletal: Positive for joint pain. Neck pain: R ankle pain.  Neurological: Negative for tingling and sensory change.  All other systems reviewed and are negative.    Objective  BP 125/75 (BP Location: Right Arm)   Pulse 92   Temp 98.4 F (36.9 C) (Oral)   Resp 16   Ht 6\' 3"  (1.905 m)   Wt 77.6 kg (171 lb)   SpO2 98%   BMI 21.37 kg/m   Intake/Output      07/26 0701 - 07/27 0700 07/27 0701 - 07/28 0700   P.O. 240    I.V. (mL/kg) 800 (10.3)    IV Piggyback 200    Total Intake(mL/kg) 1240 (16)    Urine (mL/kg/hr) 2000 (1.1)    Drains 50    Blood 25    Total Output 2075     Net -835          Stool Occurrence 1 x      Labs  Exam  Gen: laying in bed comfortably Lungs: clear bilateral Cardiac: normal rate and rhythm Abd: soft, nontender, nondistended Ext:   Right Lower Extremity      In an ace wrap                Able to move toes     Sensation is intact                Wound vac    Left Lower Extremity        No traumatic wounds, ecchymosis, or rash      Nontender      No effusions      Sens DPN, SPN, TN intact      Motor EHL, ext, flex, evers 5/5      DP 2+, PT 2+, No significant edema   Imaging    Assessment and Plan   POD/HD#: 2  Right ankle osteomyelitis with open wound. Right Medial malleolus nonunion.  WBAT bilaterally   IV antibiotics, tight sugar control, and wound vac changes until healing  Follow up with Dr. Kelly SplinterSanger next week for vac change  Follow up with Dr. Carola FrostHandy in 10-14 days  - Pain  management:  Oxycodone 5-15 mg tablet every 4 hours PRN  Morphine 4MG /ML 2 mg IV every 4 hours PRN  Tylenol tablet 650 mg every 6 hours PRN  - ABL anemia/Hemodynamics  Stable  - Medical issues   Diabetes Mellitus  - DVT/PE prophylaxis:  Lovenox  - ID:   IV vancomycin   - Activity:  WBAT bilateral  - FEN/GI prophylaxis/Foley/Lines:  Regular diet  -Ex-fix/Splint care:  Right ankle in ace wrap with wound vac  - Impediments to fracture healing:  Uncontrolled diabetes mellitus   - Dispo:  IV antibiotics, tight sugar control, and wound vac changes until healing  WBAT bilaterally  Follow up with Dr. Kelly SplinterSanger next week for Vac change either in office or if not tolerated in operating room  Follow up with Dr. Carola FrostHandy in 10-14 days  [facility]   Megan SalonSamantha Gleason Ardoin, PA-S   [facility] [phone] (P) [phone] (O) 05/14/2017 7:50 AM

## 2017-05-14 NOTE — Clinical Social Work Placement (Signed)
   CLINICAL SOCIAL WORK PLACEMENT  NOTE  Date:  05/14/2017  Patient Details  Name: Walter Hansen MRN: [id number] Date of Birth: 10/26/1980  Clinical Social Work is seeking post-discharge placement for this patient at the Skilled  Nursing Facility level of care (*CSW will initial, date and re-position this form in  chart as items are completed):  Yes   Patient/family provided with [facility] Clinical Social Work Department's list of facilities offering this level of care within the geographic area requested by the patient (or if unable, by the patient's family).  Yes   Patient/family informed of their freedom to choose among providers that offer the needed level of care, that participate in Medicare, Medicaid or managed care program needed by the patient, have an available bed and are willing to accept the patient.  Yes   Patient/family informed of [facility]'s ownership interest in [facility] and [facility], as well as of the fact that they are under no obligation to receive care at these facilities.  PASRR submitted to EDS on       PASRR number received on       Existing PASRR number confirmed on 05/12/17     FL2 transmitted to all facilities in geographic area requested by pt/family on 05/12/17     FL2 transmitted to all facilities within larger geographic area on 05/12/17     Patient informed that his/her managed care company has contracts with or will negotiate with certain facilities, including the following:        Yes   Patient/family informed of bed offers received.  Patient [facility]     Physician recommends and patient chooses bed at      Patient to be [facility] on 05/14/17.  Patient to be transferred to facility by [facility]     Patient family notified on 05/14/17 of transfer.  Name of family member notified:  Patient responsible for self and left msg for Friend/Uncle Mr. Maurine MinisterDennis      PHYSICIAN Please prepare priority discharge summary, including medications, Please prepare prescriptions, Please sign FL2     Additional Comment:    _______________________________________________ Tresa MoorePatricia V Sarayah Bacchi, LCSW 05/14/2017, 12:39 PM
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
## 2017-05-14 NOTE — Progress Notes (Addendum)
[facility] for Infectious Disease   Reason for visit: Follow up on osteomyelitis  Interval History: no fever, no new culture growth  Physical Exam: Constitutional:  Vitals:   05/13/17 2212 05/14/17 0532  BP: 134/70 125/75  Pulse: 93 92  Resp:    Temp: 98.1 F (36.7 C) 98.4 F (36.9 C)   patient appears in NAD  Impression: osteomyelitis   Plan: 1.  No changes from yesterday Vancomycin through September 5  Ok to pull the picc line on or after September 5th Bi weekly ESR, CRP Twice weekly vancomycin trough, bmp Weekly cbc All lab results to fax to [facility] [phone] I have put in the OPAT consult to assist with discharge antibiotic orders I will arrange follow up with [place]

## 2017-05-14 NOTE — Progress Notes (Signed)
Called report to [facility]. Patient is currently getting PICC line and will [facility].

## 2017-05-14 NOTE — Progress Notes (Signed)
Peripherally Inserted Central Catheter/Midline Placement  The IV Nurse has discussed with the patient and/or persons authorized to consent for the patient, the purpose of this procedure and the potential benefits and risks involved with this procedure.  The benefits include less needle sticks, lab draws from the catheter, and the patient may be discharged home with the catheter. Risks include, but not limited to, infection, bleeding, blood clot (thrombus formation), and puncture of an artery; nerve damage and irregular heartbeat and possibility to perform a PICC exchange if needed/ordered by physician.  Alternatives to this procedure were also discussed.  Bard Power PICC patient education guide, fact sheet on infection prevention and patient information card has been provided to patient /or left at bedside.    PICC/Midline Placement Documentation  PICC Single Lumen 05/14/17 PICC Right Basilic 47 cm 0 cm (Active)  Indication for Insertion or Continuance of Line Home intravenous therapies (PICC only) 05/14/2017 12:00 PM  Exposed Catheter (cm) 0 cm 05/14/2017 12:00 PM  Site Assessment Clean;Dry;Intact 05/14/2017 12:00 PM  Line Status Flushed;Saline locked;Blood return noted 05/14/2017 12:00 PM  Dressing Type Transparent;Securing device 05/14/2017 12:00 PM  Dressing Status Clean;Dry;Intact;Antimicrobial disc in place 05/14/2017 12:00 PM  Dressing Change Due 05/21/17 05/14/2017 12:00 PM       Romie Jumperlford, Marchetta Navratil Terry 05/14/2017, 12:17 PM

## 2017-05-14 NOTE — Progress Notes (Signed)
Provided discharge instructions/education, all questions and concerns addressed, patient not in distress.

## 2017-05-14 NOTE — Progress Notes (Signed)
[facility]  They state they are not able to do that. Called Patient's uncle to see if he could come get the wheelchair.  His uncle car is broke down at the moment but is going to call me back if able to get a hold of someone to pick it up.

## 2017-05-14 NOTE — Social Work (Addendum)
Clinical Social Worker facilitated patient discharge including contacting patient family and facility to confirm patient discharge plans.  Clinical information faxed to facility and family agreeable with plan.    CSW arranged ambulance transport via [facility] to [facility] at 4:00pm.    RN to call [phone] to give report prior to discharge.  Clinical Social Worker will sign off for now as social work intervention is no longer needed. Please consult us again if new need arises.  Keene BreathPatricia Johnnette Laux, LCSW Clinical Social Worker [phone]

## 2017-05-14 NOTE — Progress Notes (Signed)
PHARMACY CONSULT NOTE FOR:  OUTPATIENT  PARENTERAL ANTIBIOTIC THERAPY (OPAT)  Indication: Osteomyelitis Regimen: Vancomycin 1g IV q8h *Vancomycin trough on this regimen today is 20 - but drawn 6.5 hours after last dose so actually closer to 16. Fine to continue this dose with weekly trough monitoring.  End date: 06/23/17  IV antibiotic discharge orders are pended. To discharging provider:  please sign these orders via discharge navigator,  Select New Orders & click on the button choice - Manage This Unsigned Work.     Thank you for allowing pharmacy to be a part of this patient's care.  Fayne NorrieMillen, Amaka Gluth Brown 05/14/2017, 1:11 PM

## 2017-05-15 LAB — AEROBIC CULTURE W GRAM STAIN (SUPERFICIAL SPECIMEN)

## 2017-05-15 LAB — AEROBIC CULTURE  (SUPERFICIAL SPECIMEN)

## 2017-05-18 ENCOUNTER — Encounter (HOSPITAL_COMMUNITY): Payer: Self-pay | Admitting: Orthopedic Surgery

## 2017-05-18 LAB — ANAEROBIC CULTURE

## 2017-06-10 NOTE — Addendum Note (Signed)
Addendum  created 06/10/17 1455 by Kailon Treese, MD   Sign clinical note    

## 2017-06-28 ENCOUNTER — Ambulatory Visit: Payer: BLUE CROSS/BLUE SHIELD | Admitting: Internal Medicine

## 2017-12-03 ENCOUNTER — Other Ambulatory Visit: Payer: Self-pay

## 2017-12-03 ENCOUNTER — Encounter: Payer: Self-pay | Admitting: Vascular Surgery

## 2017-12-03 ENCOUNTER — Ambulatory Visit (INDEPENDENT_AMBULATORY_CARE_PROVIDER_SITE_OTHER): Payer: Self-pay | Admitting: Vascular Surgery

## 2017-12-03 VITALS — BP 142/90 | HR 107 | Temp 97.9°F | Resp 16 | Ht 74.0 in | Wt 176.0 lb

## 2017-12-03 DIAGNOSIS — R600 Localized edema: Secondary | ICD-10-CM
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
## 2018-01-18 DIAGNOSIS — I517 Cardiomegaly: Secondary | ICD-10-CM

## 2018-01-18 DIAGNOSIS — R9431 Abnormal electrocardiogram [ECG] [EKG]: Secondary | ICD-10-CM

## 2018-01-19 ENCOUNTER — Encounter: Payer: Self-pay | Admitting: Infectious Diseases

## 2018-02-22 ENCOUNTER — Ambulatory Visit (INDEPENDENT_AMBULATORY_CARE_PROVIDER_SITE_OTHER): Payer: Self-pay | Admitting: Infectious Diseases

## 2018-02-22 ENCOUNTER — Encounter: Payer: Self-pay | Admitting: Infectious Diseases

## 2018-02-22 VITALS — BP 136/84 | HR 89 | Temp 98.7°F

## 2018-02-22 DIAGNOSIS — L97522 Non-pressure chronic ulcer of other part of left foot with fat layer exposed: Secondary | ICD-10-CM

## 2018-02-22 DIAGNOSIS — Z794 Long term (current) use of insulin: Secondary | ICD-10-CM

## 2018-02-22 DIAGNOSIS — E119 Type 2 diabetes mellitus without complications: Secondary | ICD-10-CM

## 2018-02-22 DIAGNOSIS — Z5181 Encounter for therapeutic drug level monitoring: Secondary | ICD-10-CM

## 2018-02-22 NOTE — Patient Instructions (Addendum)
We will do some labs today and get records of what you have been through so far for treatment to determine if anything further is needed aside from good wound care.   Return as needed for now.

## 2018-02-22 NOTE — Assessment & Plan Note (Signed)
Will request Hgb A1C from primary care team.
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
## 2018-02-22 NOTE — Assessment & Plan Note (Signed)
I have requested labs from treatment course including vancomycin trough levels. I will check CMET, CBC, CRP, ESR today for assistance in determining further need for ongoing suppressive treatment.

## 2018-02-22 NOTE — Assessment & Plan Note (Addendum)
Appears he had a report of suggested osteomyelitis on an x-ray changes to many 4 of the 5 metatarsal heads/spaces on the left foot in the setting of chronic ulcer/pressure wound. He is completely non-weight bearing which will help with treatment. He needs to continue with compression therapy as recommended by vascular/vein team to prevent formation of other ulcers related to his diabetes/limited mobility and allow for healing of this site. Will await records but it seems per his report that he has had an adequate treatment course with a reasonable empiric regimen of IV Vancomycin and Cefepime. I am not certain if this is improved or not and will attempt to reach out to his wound care team (although he is not certain as to who that is) about progress of wound.   Return to clinic pending gathering other clinical information regarding work up/treatment of this.

## 2018-02-23 LAB — CBC
HCT: 36.7 % — ABNORMAL LOW (ref 38.5–50.0)
HEMOGLOBIN: 12.3 g/dL — AB (ref 13.2–17.1)
MCH: 29.2 pg (ref 27.0–33.0)
MCHC: 33.5 g/dL (ref 32.0–36.0)
MCV: 87.2 fL (ref 80.0–100.0)
MPV: 9.6 fL (ref 7.5–12.5)
Platelets: 416 10*3/uL — ABNORMAL HIGH (ref 140–400)
RBC: 4.21 10*6/uL (ref 4.20–5.80)
RDW: 13.1 % (ref 11.0–15.0)
WBC: 9 10*3/uL (ref 3.8–10.8)

## 2018-02-23 LAB — COMPREHENSIVE METABOLIC PANEL
AG RATIO: 1.1 (calc) (ref 1.0–2.5)
ALT: 17 U/L (ref 9–46)
AST: 13 U/L (ref 10–40)
Albumin: 4 g/dL (ref 3.6–5.1)
Alkaline phosphatase (APISO): 99 U/L (ref 40–115)
BUN: 20 mg/dL (ref 7–25)
CALCIUM: 9.7 mg/dL (ref 8.6–10.3)
CO2: 30 mmol/L (ref 20–32)
Chloride: 106 mmol/L (ref 98–110)
Creat: 1.05 mg/dL (ref 0.60–1.35)
Globulin: 3.7 g/dL (calc) (ref 1.9–3.7)
Glucose, Bld: 122 mg/dL — ABNORMAL HIGH (ref 65–99)
Potassium: 4.7 mmol/L (ref 3.5–5.3)
Sodium: 143 mmol/L (ref 135–146)
Total Bilirubin: 0.3 mg/dL (ref 0.2–1.2)
Total Protein: 7.7 g/dL (ref 6.1–8.1)

## 2018-02-23 LAB — SEDIMENTATION RATE: Sed Rate: 53 mm/h — ABNORMAL HIGH (ref 0–15)

## 2018-02-23 LAB — C-REACTIVE PROTEIN: CRP: 38.7 mg/L — AB (ref <8.0)

## 2018-02-25 ENCOUNTER — Other Ambulatory Visit: Payer: Self-pay | Admitting: Pharmacist

## 2018-02-25 NOTE — Progress Notes (Unsigned)
r 

## 2018-03-15 ENCOUNTER — Telehealth: Payer: Self-pay | Admitting: Infectious Diseases

## 2018-03-15 NOTE — Telephone Encounter (Signed)
Records finally sent for the patient after his visit with me and completion of his empiric osteomyelitis treatment for the left #5 plantar wound per his PCP with IV Cefepime + Vancomycin. When I saw him he had completed therapy almost a month prior. No fevers or worsened status of the foot per his report however no clinical records were available for review.   Wound Clinic notes 02/11/18 - indicate healing area with red/pink wound bed and granulation tissue. Some necrotic slough present and tenderness present on examination.   CRP/ESR still elevated however uncertain as to patient's baseline. Would continue to monitor off antibiotics presently @ [facility] through his PCP Anderson Malta, NP [phone]. If he continues to have poor wound healing or worsened status of the wound would recommend MRI evaluation of the left foot to evaluate for drainable fluid collection.   Janene Madeira, NP

## 2018-04-05 IMAGING — DX DG KNEE 1-2V PORT*R*
3 series · 3 of 3 positions shown · non-contrast
Comparison: Right knee intraoperative images performed earlier
today at [DATE] p.m.

CLINICAL DATA: Postoperative radiographs of the right knee. Initial
encounter.

EXAM:
PORTABLE RIGHT KNEE - 1-2 VIEW

[knee obl (1 of 2)]
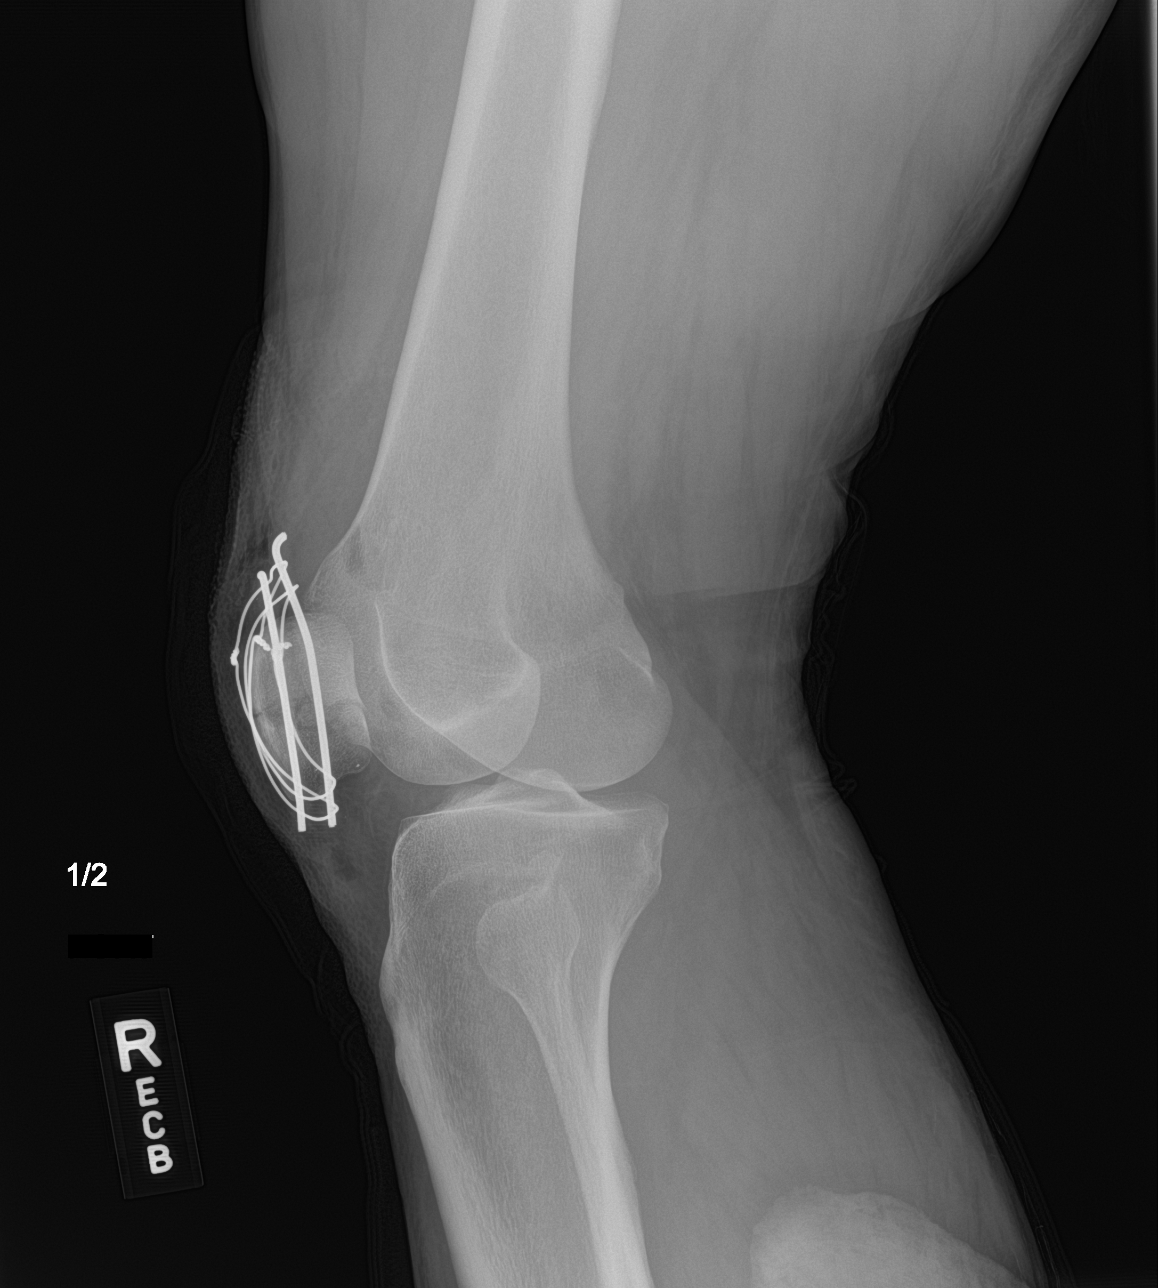

[knee obl (2 of 2)]
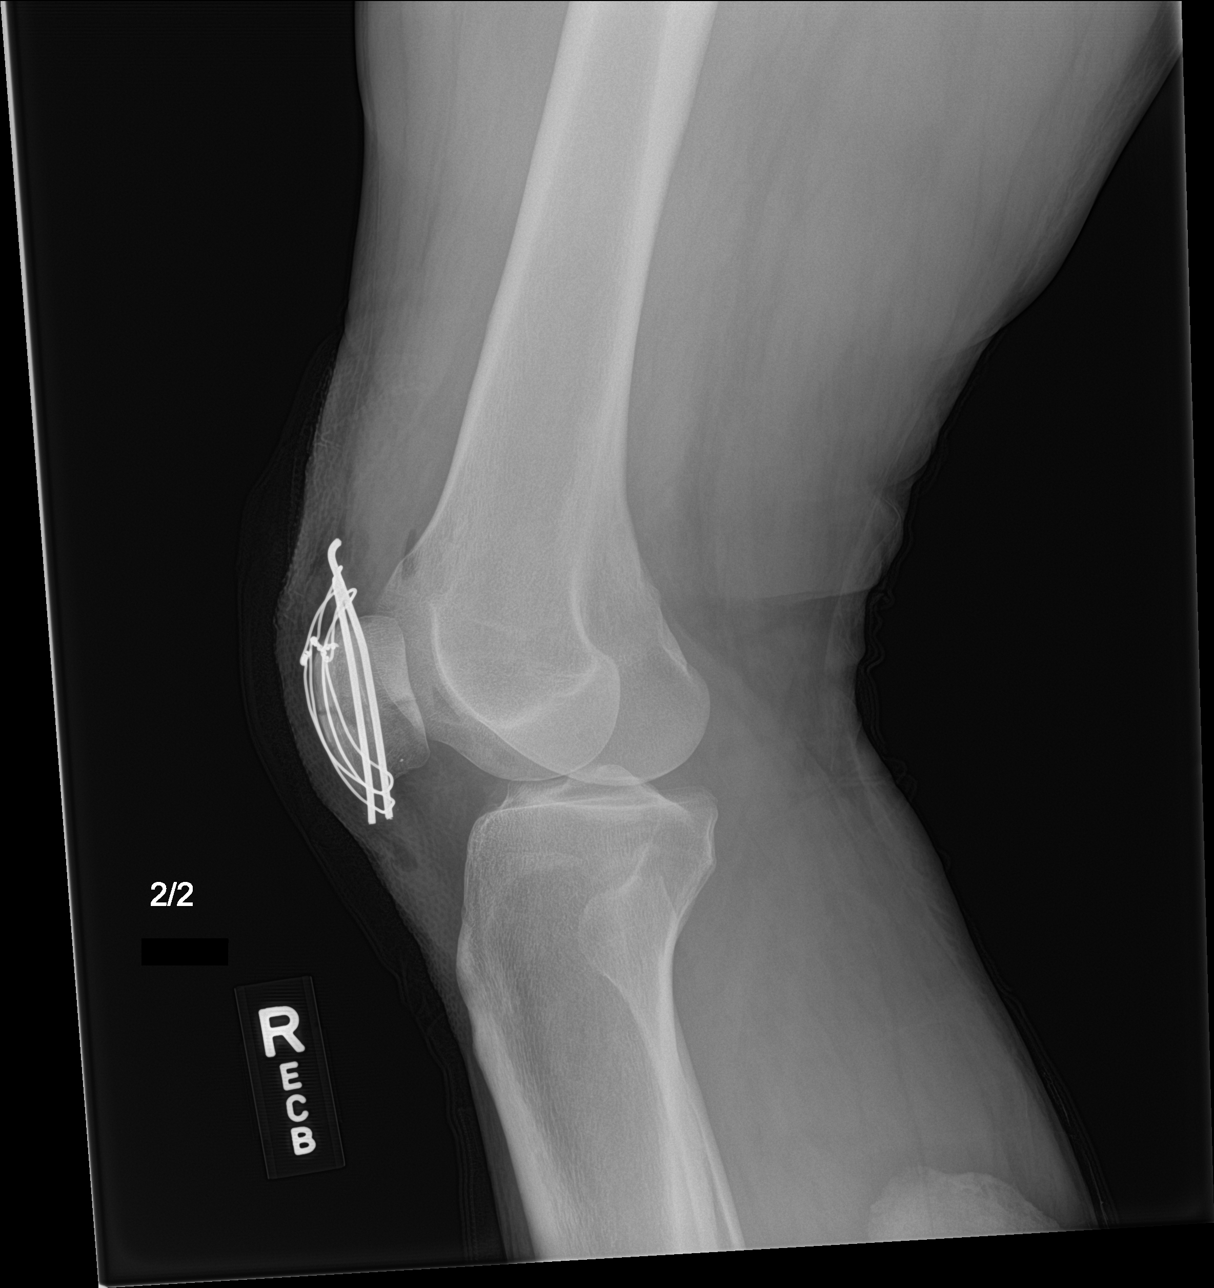

[knee ap]
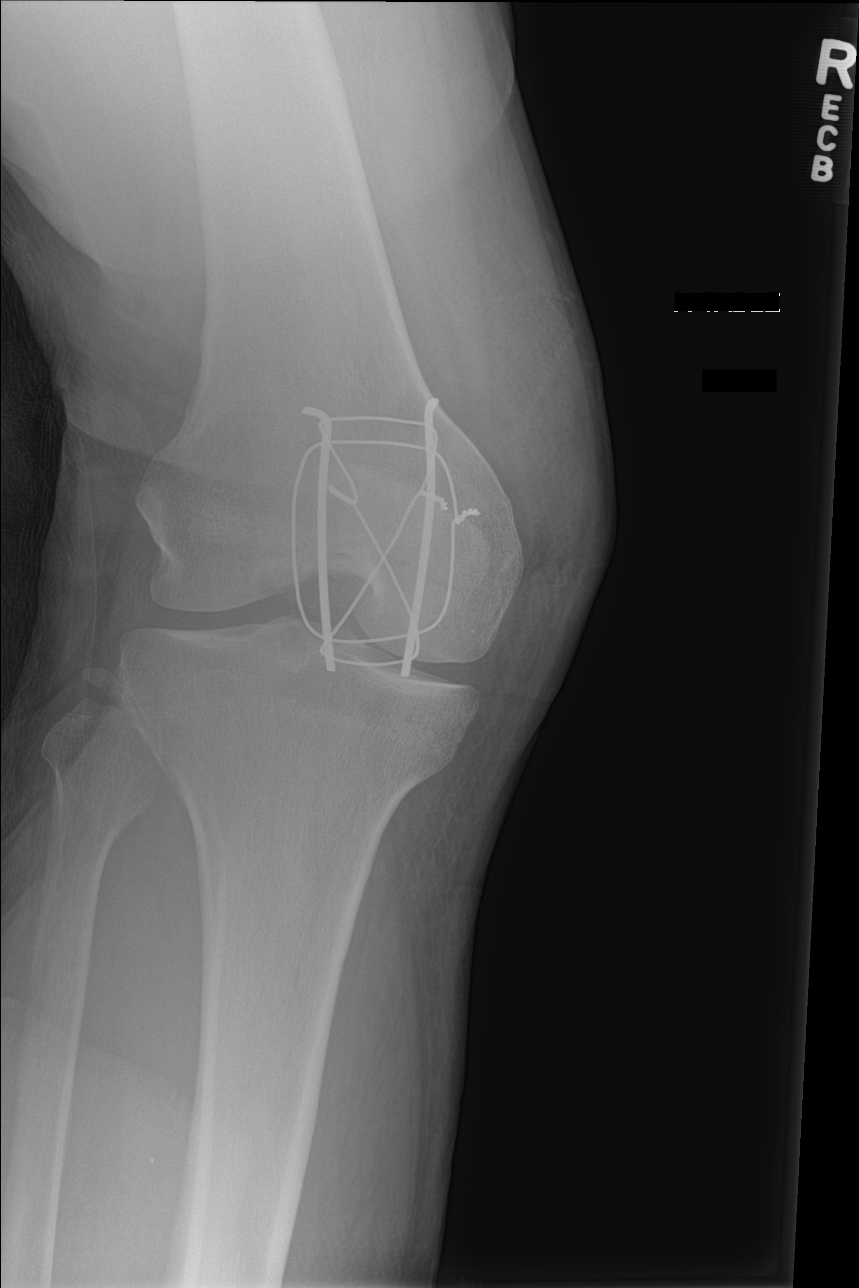

[3 of 3 positions shown; findings below may reference images not displayed]

FINDINGS: The patient is status post internal fixation of the patellar
fracture, in grossly anatomic alignment. No new fractures are seen.
Surrounding soft tissue swelling is noted. Air is noted at the joint
space.

A mildly displaced avulsion fracture is again noted at the head of
the fibula.
IMPRESSION: Status post internal fixation of patellar fracture in grossly
anatomic alignment. Mildly displaced avulsion fracture again noted
at the head of the fibula. Soft tissue swelling noted about the
right knee.

## 2018-04-05 IMAGING — RF DG KNEE COMPLETE 4+V*R*
1 series · 3 of 3 positions shown · non-contrast
Comparison: 03/05/2017.

CLINICAL DATA: 35-year-old male with patella fracture. Subsequent
encounter.

EXAM:
RIGHT KNEE - COMPLETE 4+ VIEW

[Series 1: run · 3 of 3 slices shown]
[im 1/3]
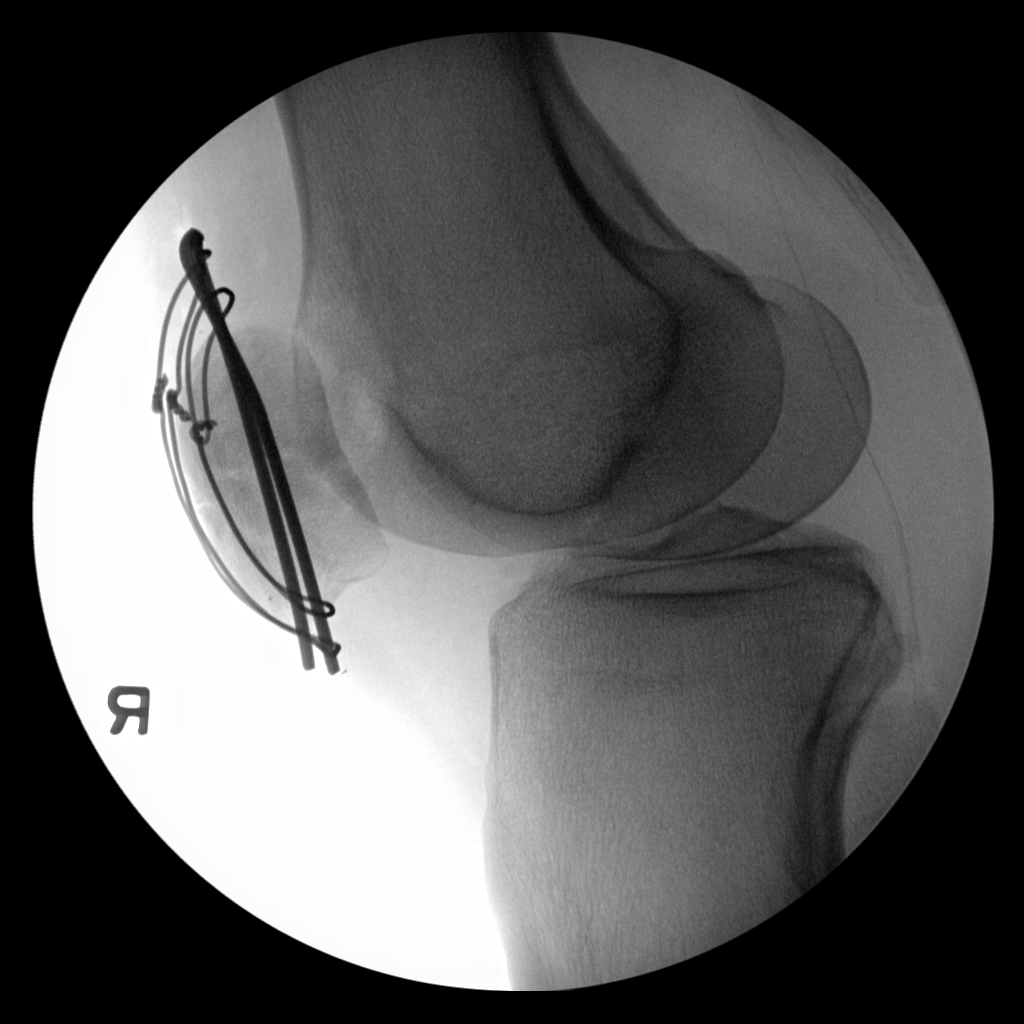
[im 2/3]
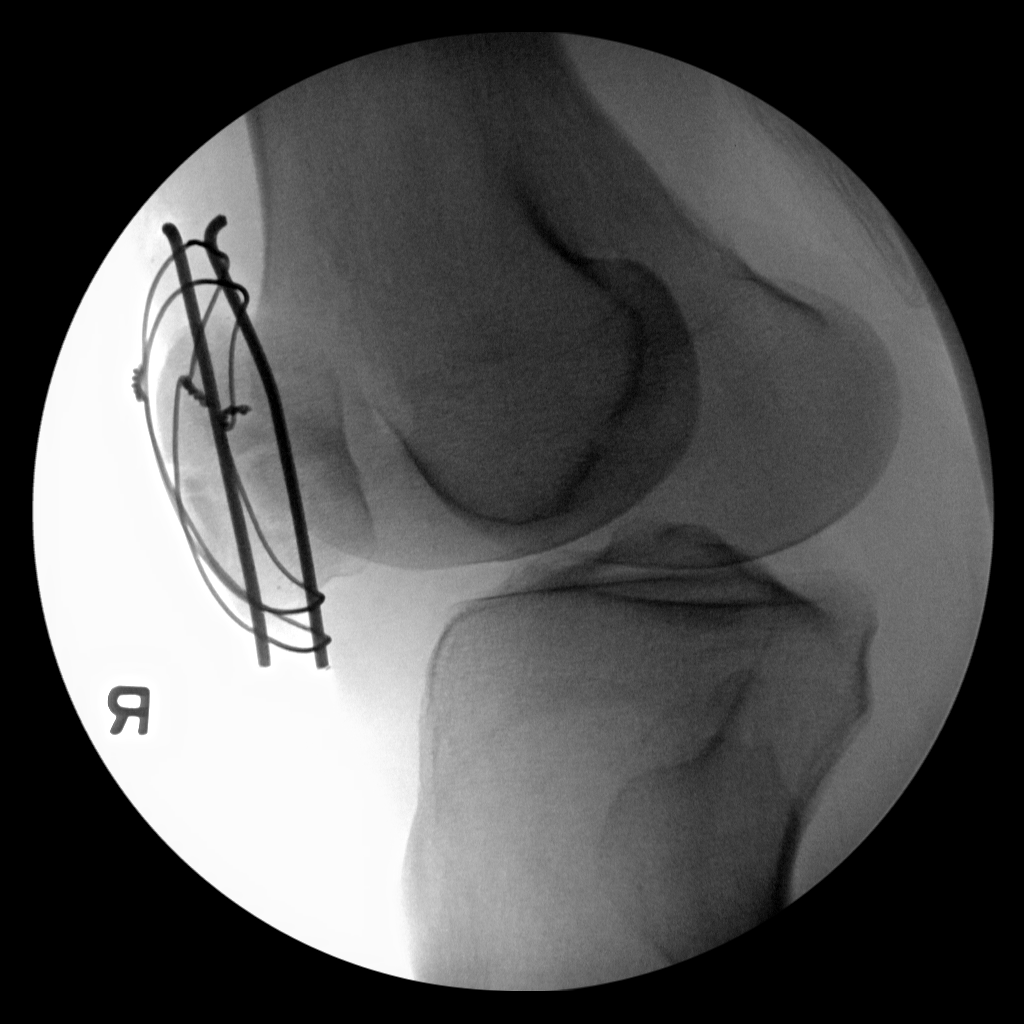
[im 3/3]
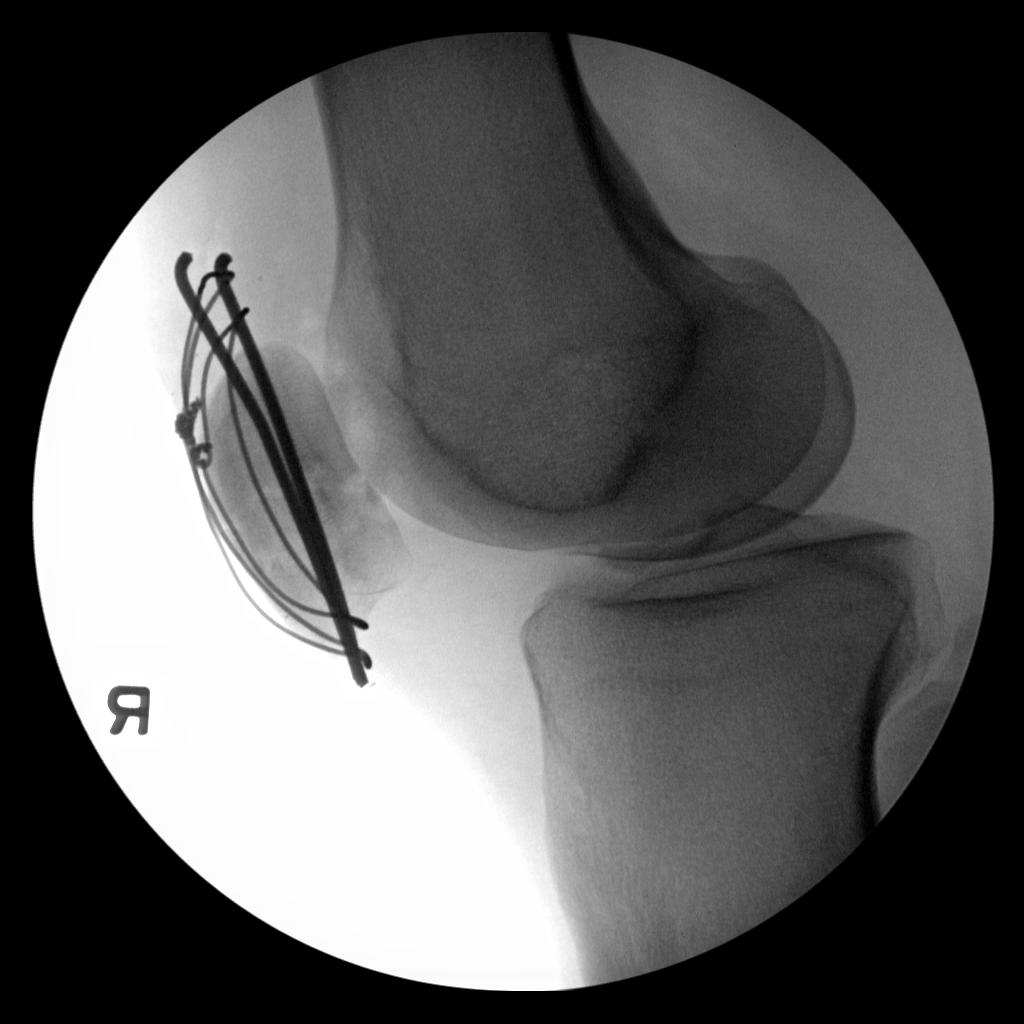

[3 of 3 positions shown; findings below may reference images not displayed]

FINDINGS: Three intraoperative C-arm views submitted for review after surgery
reveals cerclage wires transfixing comminuted patellar fracture.
Frontal view not obtained.
IMPRESSION: Open reduction and internal fixation right patella fracture.

## 2018-04-05 IMAGING — DX DG ANKLE PORT 2V*R*
2 series · 2 of 2 positions shown · non-contrast
Comparison: Right ankle radiographs performed 03/05/2017

CLINICAL DATA: Postoperative radiograph of the right ankle. Initial
encounter.

EXAM:
PORTABLE RIGHT ANKLE - 2 VIEW

[ankle ap]
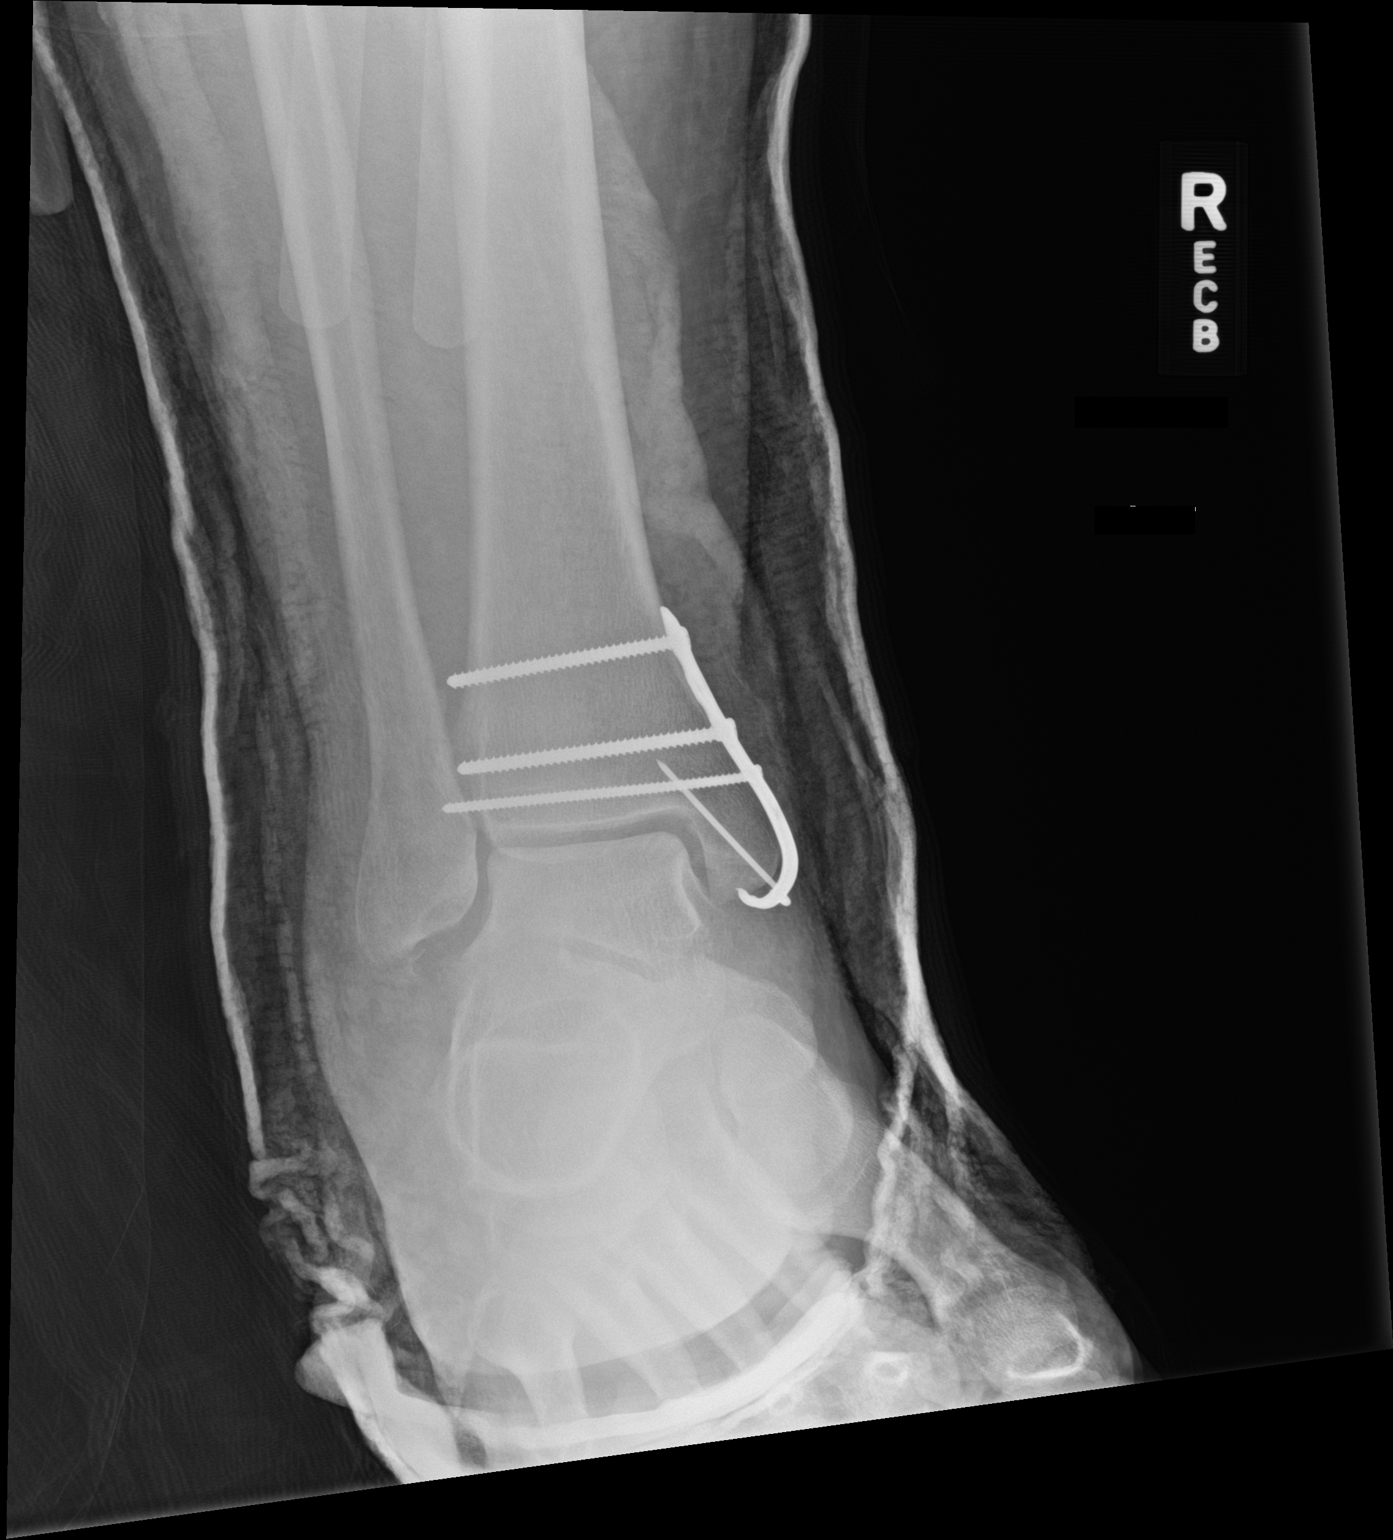

[ankle lat]
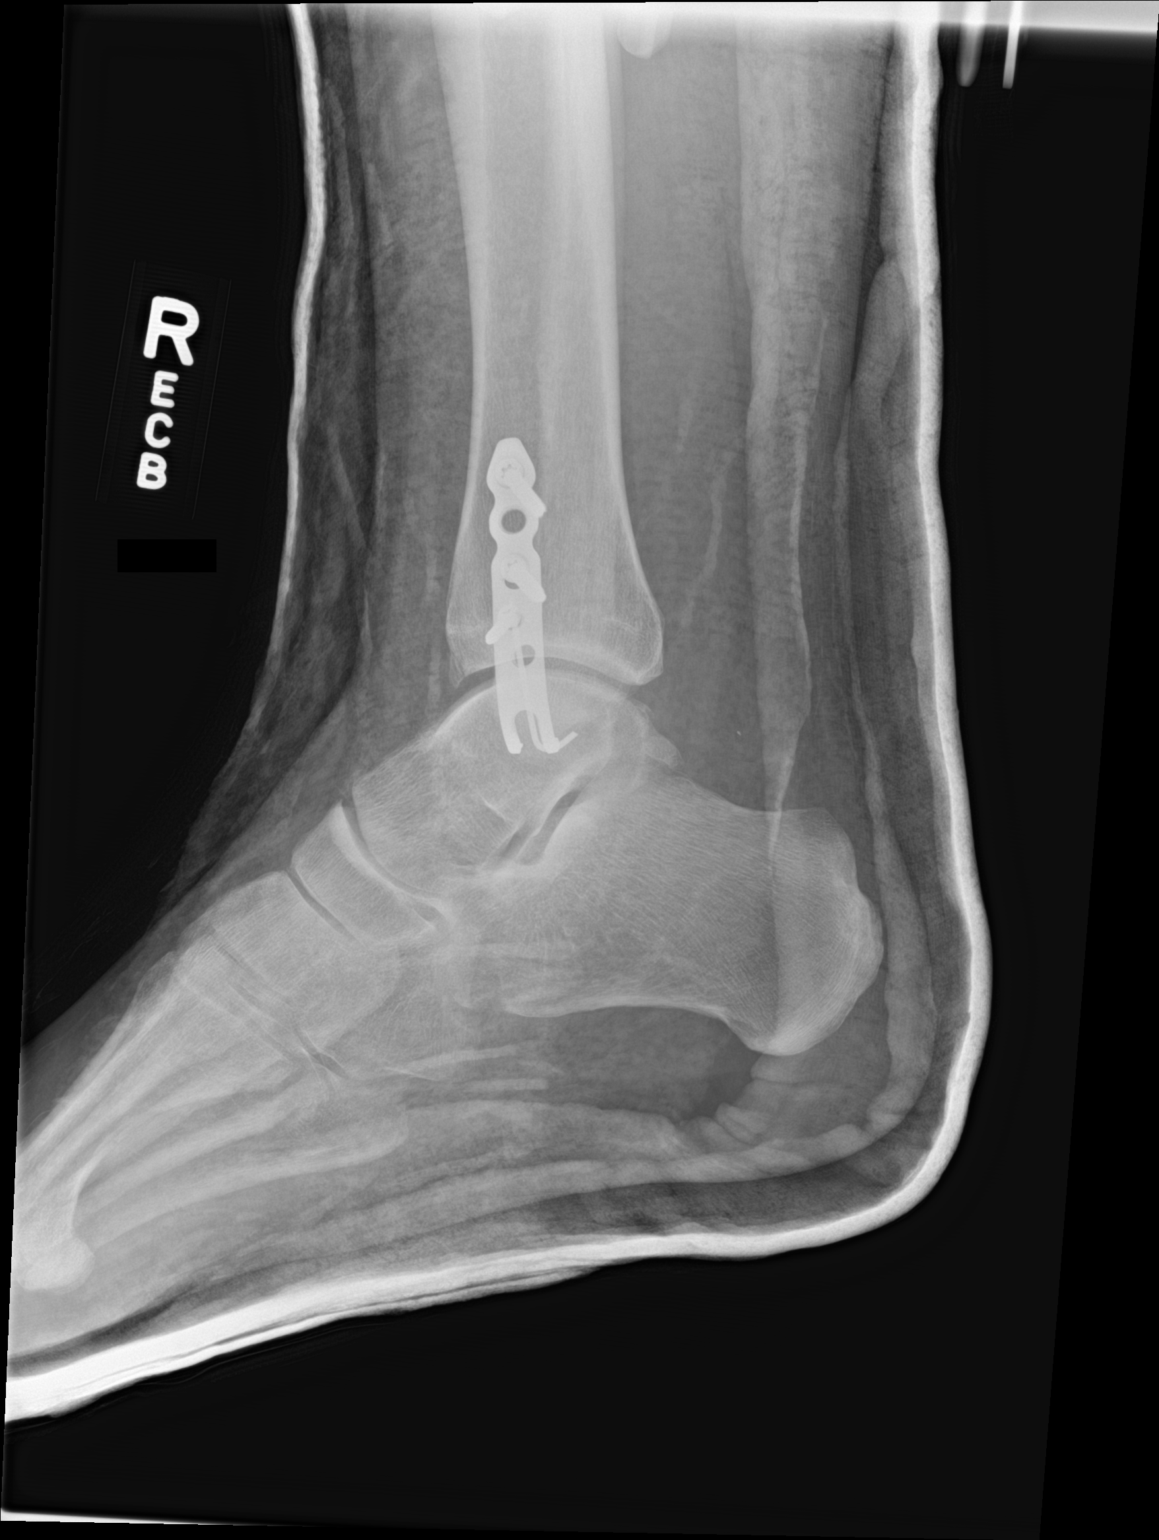

[2 of 2 positions shown; findings below may reference images not displayed]

FINDINGS: A plate and screws are noted at the distal tibia. No new fractures
are seen. The distal fibula is unremarkable.

The ankle mortise is grossly unremarkable. Soft tissue swelling is
noted about the ankle. The cast is grossly unremarkable in
appearance.
IMPRESSION: No new fracture seen. Hardware at the distal tibia is grossly
unremarkable.

## 2018-04-05 IMAGING — RF DG ANKLE COMPLETE 3+V*R*
1 series · 4 of 4 positions shown · non-contrast
Comparison: 03/05/2017.

CLINICAL DATA: 35-year-old male with right ankle fracture.
Subsequent encounter.

EXAM:
RIGHT ANKLE - COMPLETE 3+ VIEW

[Series 1: run · 4 of 4 slices shown]
[im 1/4]
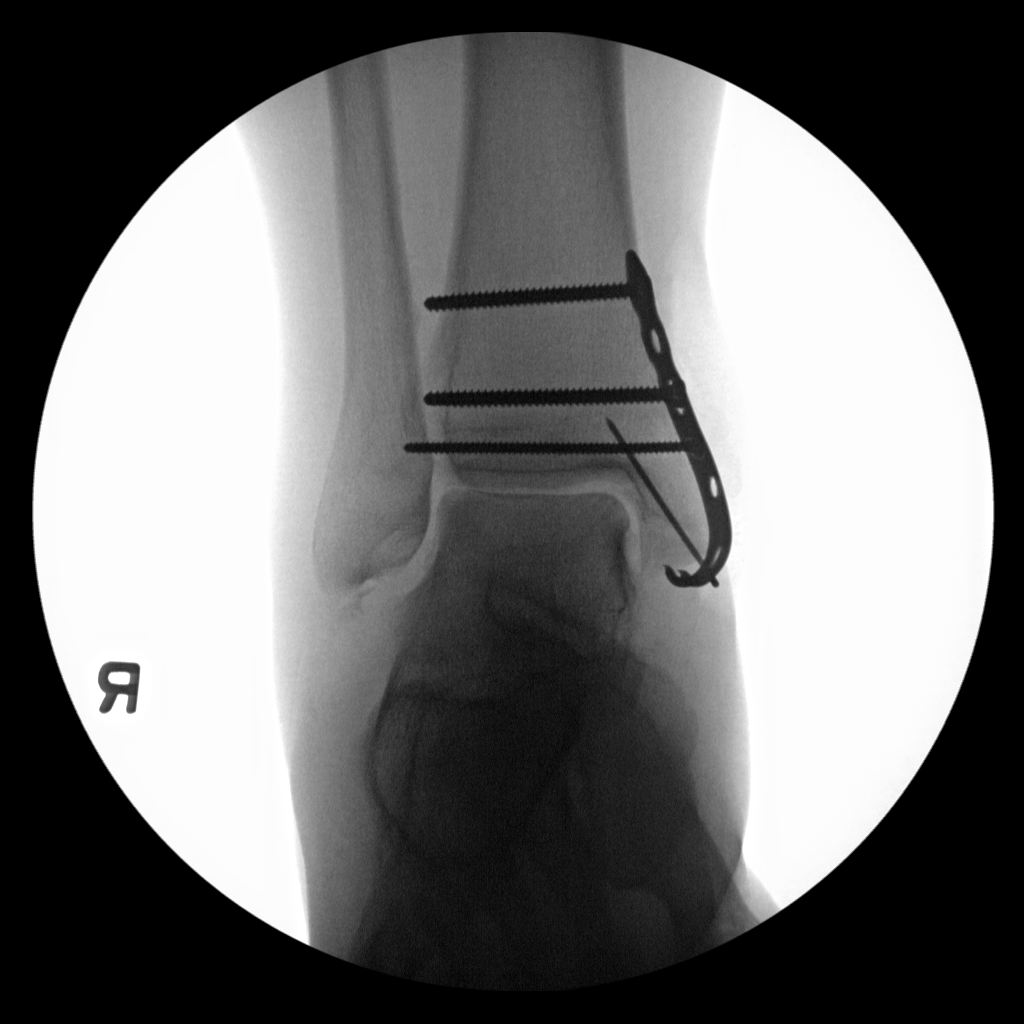
[im 2/4]
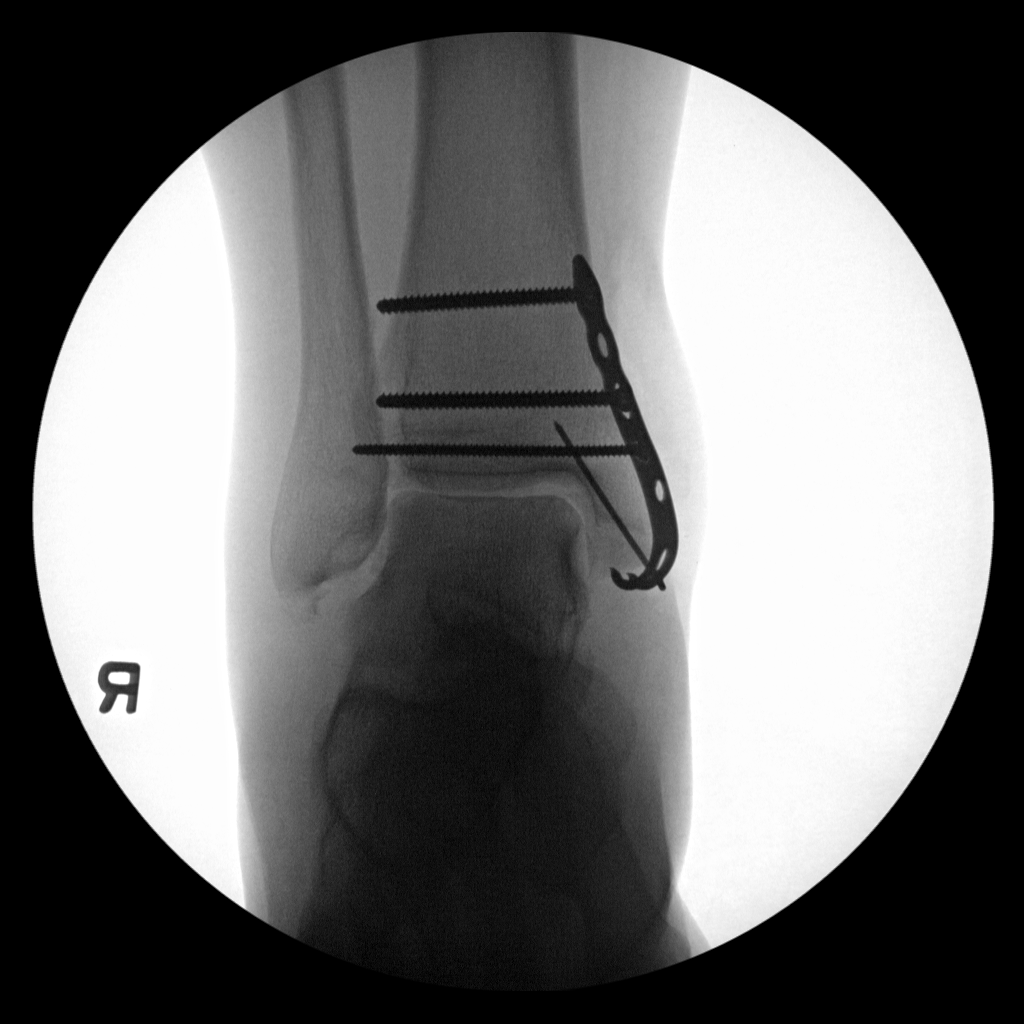
[im 3/4]
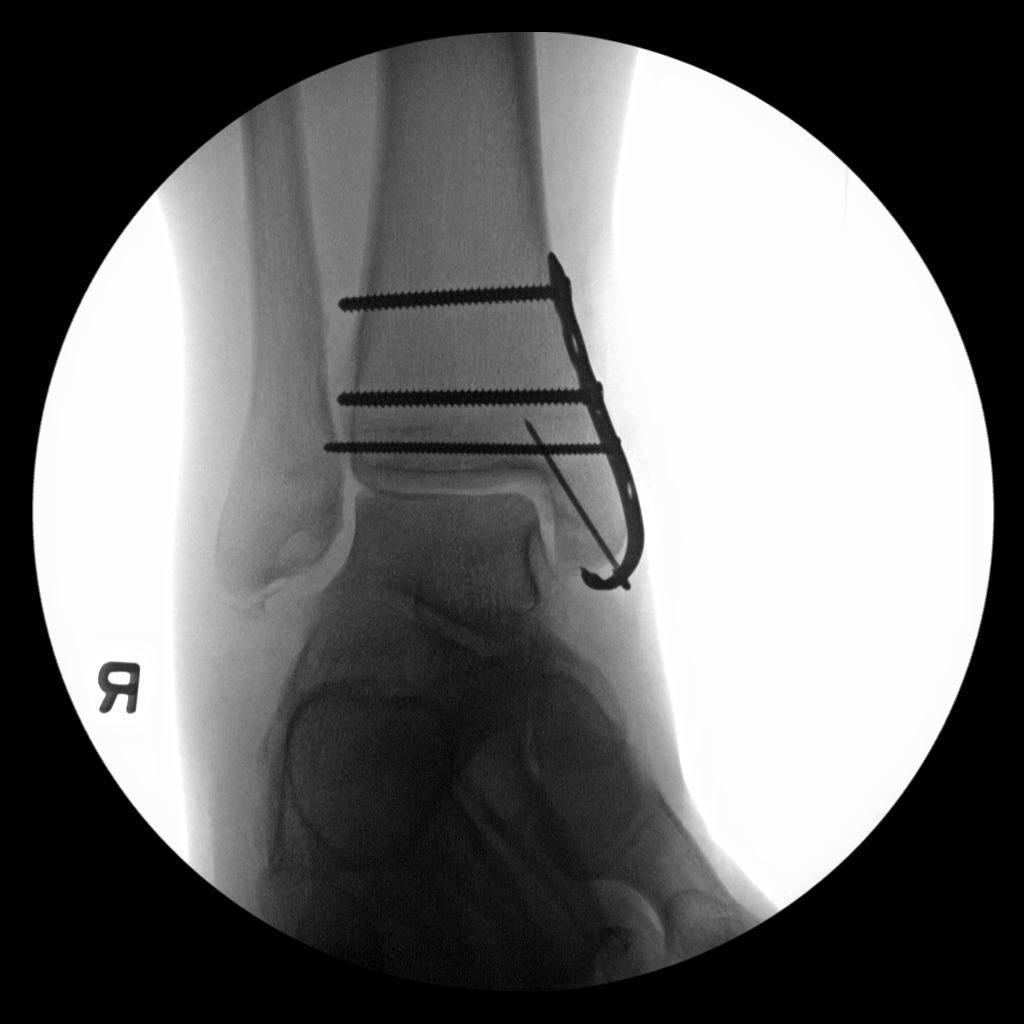
[im 4/4]
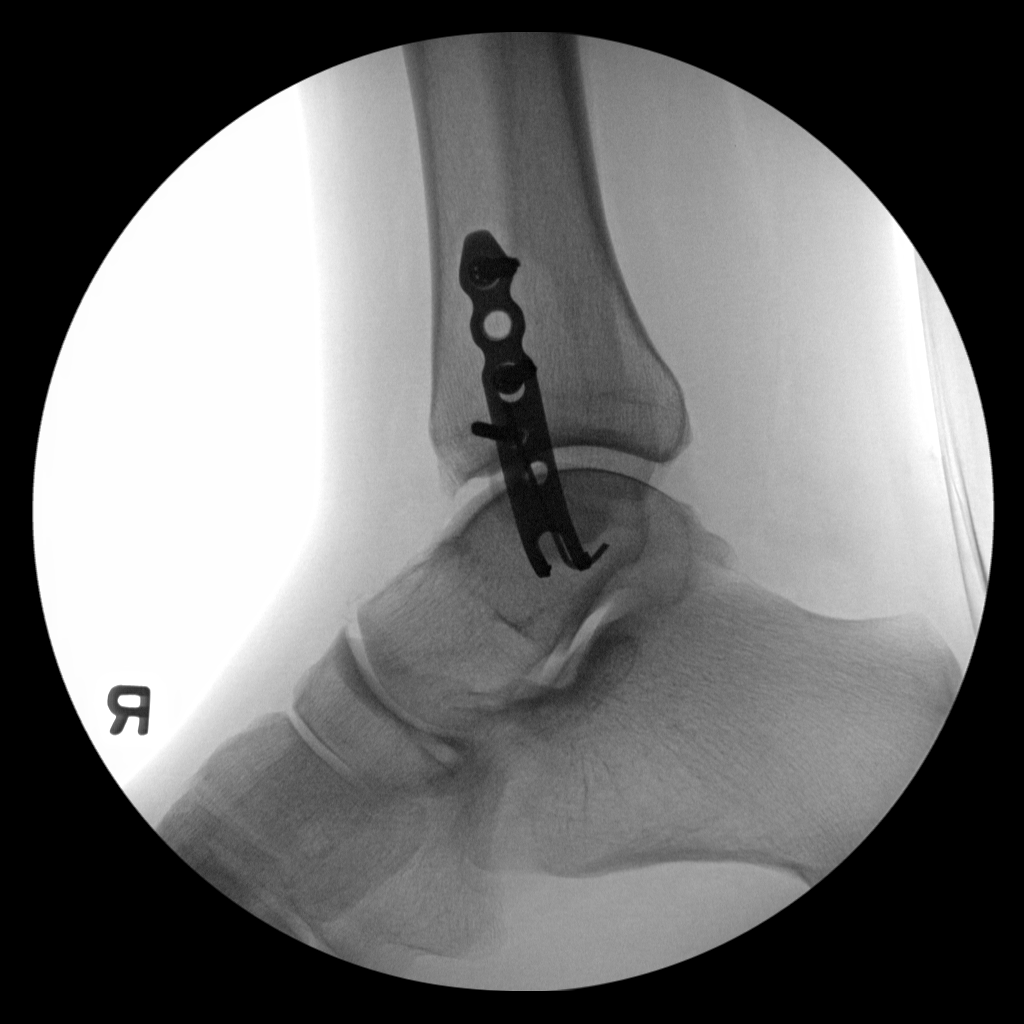

[4 of 4 positions shown; findings below may reference images not displayed]

FINDINGS: Four C-arm views submitted for review after surgery. This reveals
sideplate, screws and pin transfixing right medial malleolar
fracture.

Inferior lateral malleolar fracture.
IMPRESSION: Open reduction and internal fixation of right medial malleolar
fracture.

Inferior right lateral malleolar fracture.

## 2019-11-16 ENCOUNTER — Telehealth (HOSPITAL_COMMUNITY): Payer: Self-pay

## 2019-11-16 ENCOUNTER — Other Ambulatory Visit: Payer: Self-pay

## 2019-11-16 DIAGNOSIS — I739 Peripheral vascular disease, unspecified: Secondary | ICD-10-CM

## 2019-11-16 NOTE — Telephone Encounter (Signed)
The above patient or their representative was contacted and gave the following answers to these questions:         Do you have any of the following symptoms?    NO  Fever                    Cough                   Shortness of breath  Do  you have any of the following other symptoms?    muscle pain         vomiting,        diarrhea        rash         weakness        red eye        abdominal pain         bruising          bruising or bleeding              joint pain           severe headache    Have you been in contact with someone who was or has been sick in the past 2 weeks?  NO  Yes                 Unsure                         Unable to assess   Does the person that you were in contact with have any of the following symptoms?   Cough         shortness of breath           muscle pain         vomiting,            diarrhea            rash            weakness           fever            red eye           abdominal pain           bruising  or  bleeding                joint pain                severe headache                 COMMENTS OR ACTION PLAN FOR THIS PATIENT:        ALL QUESTIONS WERE ANSWERED BY CAREGIVER GRETCHEN/[facility]

## 2019-11-17 ENCOUNTER — Other Ambulatory Visit: Payer: Self-pay

## 2019-11-17 ENCOUNTER — Ambulatory Visit (HOSPITAL_COMMUNITY)
Admission: RE | Admit: 2019-11-17 | Discharge: 2019-11-17 | Disposition: A | Discharge status: Home / Self Care | Payer: Medicare Other | Source: Ambulatory Visit | Attending: Surgery | Admitting: Surgery

## 2019-11-17 ENCOUNTER — Encounter: Payer: Self-pay | Admitting: Vascular Surgery

## 2019-11-17 ENCOUNTER — Ambulatory Visit (INDEPENDENT_AMBULATORY_CARE_PROVIDER_SITE_OTHER): Payer: Medicare Other | Admitting: Vascular Surgery

## 2019-11-17 VITALS — BP 119/67 | HR 69 | Temp 98.0°F | Resp 20 | Ht 74.0 in | Wt 266.0 lb

## 2019-11-17 DIAGNOSIS — I739 Peripheral vascular disease, unspecified: Secondary | ICD-10-CM | POA: Insufficient documentation

## 2019-11-17 DIAGNOSIS — R600 Localized edema: Secondary | ICD-10-CM
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
## 2019-11-20 ENCOUNTER — Other Ambulatory Visit: Payer: Self-pay | Admitting: *Deleted

## 2019-11-20 DIAGNOSIS — I739 Peripheral vascular disease, unspecified: Secondary | ICD-10-CM

## 2019-12-15 ENCOUNTER — Ambulatory Visit: Payer: Medicare Other

## 2019-12-15 ENCOUNTER — Encounter: Payer: Self-pay | Admitting: Vascular Surgery

## 2019-12-15 ENCOUNTER — Ambulatory Visit (INDEPENDENT_AMBULATORY_CARE_PROVIDER_SITE_OTHER): Payer: Medicare Other | Admitting: Physician Assistant

## 2019-12-15 ENCOUNTER — Ambulatory Visit (HOSPITAL_COMMUNITY)
Admission: RE | Admit: 2019-12-15 | Discharge: 2019-12-15 | Disposition: A | Discharge status: Home / Self Care | Payer: Medicare Other | Source: Ambulatory Visit | Attending: Surgery | Admitting: Surgery

## 2019-12-15 ENCOUNTER — Other Ambulatory Visit: Payer: Self-pay

## 2019-12-15 VITALS — BP 122/77 | HR 83 | Temp 98.2°F | Resp 17 | Ht 75.0 in | Wt 265.0 lb

## 2019-12-15 DIAGNOSIS — R600 Localized edema: Secondary | ICD-10-CM | POA: Insufficient documentation

## 2019-12-15 DIAGNOSIS — L97522 Non-pressure chronic ulcer of other part of left foot with fat layer exposed: Secondary | ICD-10-CM | POA: Diagnosis not present

## 2019-12-15 DIAGNOSIS — I739 Peripheral vascular disease, unspecified: Secondary | ICD-10-CM | POA: Insufficient documentation
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
## 2020-02-14 ENCOUNTER — Encounter: Payer: Self-pay | Admitting: Neurology

## 2020-02-14 ENCOUNTER — Ambulatory Visit (INDEPENDENT_AMBULATORY_CARE_PROVIDER_SITE_OTHER): Payer: Medicare Other | Admitting: Neurology

## 2020-02-14 ENCOUNTER — Telehealth: Payer: Self-pay | Admitting: Neurology

## 2020-02-14 ENCOUNTER — Other Ambulatory Visit: Payer: Self-pay

## 2020-02-14 VITALS — BP 122/82 | HR 98 | Temp 97.1°F

## 2020-02-14 DIAGNOSIS — G510 Bell's palsy: Secondary | ICD-10-CM

## 2020-02-14 DIAGNOSIS — Z8669 Personal history of other diseases of the nervous system and sense organs: Secondary | ICD-10-CM | POA: Diagnosis not present

## 2020-02-14 NOTE — Patient Instructions (Addendum)
Your facial weakness seems to have resolved. I do not see any sign of current Bell's palsy. As discussed, I will order a brain MRI with and without contrast to rule out a structural cause of your symptoms.  We will need to do a blood test to make sure your kidney function is okay in order to proceed with a MRI with contrast.  We will call you with the results, I will plan to see you back if needed.

## 2020-02-14 NOTE — Progress Notes (Signed)
Subjective:    Patient ID: Walter Hansen is a 38 y.o. male.  HPI     Walter Foley, MD, PhD [facility] [address] [address] [place], [place] [address]  Dear Walter Hansen,   I saw your patient, Walter Hansen, upon your kind request in my neurologic clinic today for initial consultation of his facial weakness, concern for Bell's palsy.  The patient is unaccompanied today and came via Transportation from [facility] and rehab.  He has been residing there since 2018.  As you know, Walter Hansen is a 39 year old right-handed gentleman with an underlying medical history of diabetes, vitamin D deficiency, osteomyelitis, multiple bony injuries 25 right leg, cholecystitis, status post ORIF of right ankle and patella, currently on treatment for cellulitis of the left leg, and morbid obesity, who was noted to have right facial weakness about a month ago.  I reviewed your office note from 01/12/2020.  He reported having had Bell's palsy when he was a teenager.  He was treated with prednisone for a week.  He reports that his facial weakness improved after about 2 weeks.  He does not feel any residual weakness currently.  He denies any sudden onset of one-sided weakness otherwise, no new numbness, no slurring of speech, no recurrent headaches.  He is in a wheelchair.  He reports swelling is in the left leg, he walks some with a walker.  He did not bring a walker today.  He denies any visual symptoms such as double vision, pain in the eyes or blurry vision.  His Past Medical History Is Significant For: Past Medical History:  Diagnosis Date  . Closed dislocation of right hip (HCC) 03/06/2017  . Closed right ankle fracture 03/06/2017  . Comminuted fracture of right patella, closed, initial encounter 03/06/2017  . Diabetes mellitus without complication (HCC)   . Hyperlipidemia   . Hypertension   . Vitamin D deficiency 03/10/2017    His Past Surgical History Is  Significant For: Past Surgical History:  Procedure Laterality Date  . APPLICATION OF WOUND VAC Right 05/13/2017   Procedure: APPLICATION OF WOUND VAC- PREVENA RIGHT TIBIA/ANKLE;  Surgeon: [name], MD;  Location: [facility] OR;  Service: Orthopedics;  Laterality: Right;  . HARDWARE REMOVAL Right 05/11/2017   Procedure: HARDWARE REMOVAL OF RIGHT ANKLE;  Surgeon: [name], MD;  Location: [facility] OR;  Service: Orthopedics;  Laterality: Right;  . HIP CLOSED REDUCTION Right 03/05/2017   Procedure: CLOSED REDUCTION HIP;  Surgeon: [name], MD;  Location: [facility] OR;  Service: Orthopedics;  Laterality: Right;  . I & D EXTREMITY Right 03/05/2017   Procedure: IRRIGATION AND DEBRIDEMENT ANKLE AND LEG;  Surgeon: [name], MD;  Location: [facility] OR;  Service: Orthopedics;  Laterality: Right;  . ORIF ANKLE FRACTURE Right 03/09/2017   Procedure: OPEN REDUCTION INTERNAL FIXATION (ORIF) ANKLE FRACTURE;  Surgeon: [name], MD;  Location: [facility] OR;  Service: Orthopedics;  Laterality: Right;  . ORIF PATELLA Right 03/09/2017   Procedure: OPEN REDUCTION INTERNAL (ORIF) FIXATION PATELLA;  Surgeon: [name], MD;  Location: [facility] OR;  Service: Orthopedics;  Laterality: Right;  . ORIF PELVIC FRACTURE Right 03/09/2017   Procedure: OPEN TREATMENT HIP DISLOCATION;  Surgeon: [name], MD;  Location: [facility] OR;  Service: Orthopedics;  Laterality: Right;    His Family History Is Significant For: Family History  Problem Relation Age of Onset  . Diabetes Father     His Social History Is Significant For: Social History   Socioeconomic History  .  Marital status: Single    Spouse name: Not on file  . Number of children: Not on file  . Years of education: Not on file  . Highest education level: Not on file  Occupational History  . Not on file  Tobacco Use  . Smoking status: Never Smoker  . Smokeless tobacco: Never Used  Substance and Sexual Activity  . Alcohol use: No  . Drug use: No  . Sexual activity: Not  on file  Other Topics Concern  . Not on file  Social History Narrative  . Not on file   Social Determinants of Health   Financial Resource Strain:   . Difficulty of Paying Living Expenses:   Food Insecurity:   . Worried About Programme researcher, broadcasting/film/video in the Last Year:   . Barista in the Last Year:   Transportation Needs:   . Freight forwarder (Medical):   Marland Kitchen Lack of Transportation (Non-Medical):   Physical Activity:   . Days of Exercise per Week:   . Minutes of Exercise per Session:   Stress:   . Feeling of Stress :   Social Connections:   . Frequency of Communication with Friends and Family:   . Frequency of Social Gatherings with Friends and Family:   . Attends Religious Services:   . Active Member of Clubs or Organizations:   . Attends Banker Meetings:   Marland Kitchen Marital Status:     His Allergies Are:  Allergies  Allergen Reactions  . Penicillins Other (See Comments)    Testicles swell up Testicles swell up  :   His Current Medications Are:  Outpatient Encounter Medications as of 02/14/2020  Medication Sig  . amLODipine (NORVASC) 10 MG tablet Take 10 mg by mouth daily.  Marland Kitchen aspirin EC 81 MG tablet Take 81 mg by mouth daily.  Marland Kitchen atorvastatin (LIPITOR) 40 MG tablet Take 40 mg by mouth daily.  Marland Kitchen docusate sodium (COLACE) 100 MG capsule Take 1 capsule (100 mg total) by mouth 2 (two) times daily.  . fenofibrate (TRICOR) 145 MG tablet Take 145 mg by mouth daily.  Marland Kitchen glipiZIDE (GLUCOTROL) 10 MG tablet Take 10 mg by mouth daily before breakfast.  . glipiZIDE (GLUCOTROL) 5 MG tablet Take 5 mg by mouth daily before breakfast.  . insulin aspart (NOVOLOG) 100 UNIT/ML injection Inject 3 Units into the skin 3 (three) times daily with meals. (Patient taking differently: Inject 0-15 Units into the skin See admin instructions. Inject 3 units SQ 3 times daily with meals. Inject 0-15 units SQ before meals and at bedtime per sliding scale if BS 150-200 = 2 units; 201-250 = 4  units; 251-300 = 6 units; 301-350 = 8 units; 3511-400 = 10 units)  . liraglutide (VICTOZA) 18 MG/3ML SOPN Inject into the skin.  Marland Kitchen lisinopril (ZESTRIL) 10 MG tablet Take 10 mg by mouth daily.  . Multiple Vitamins-Minerals (MULTIVITAMIN WITH MINERALS) tablet Take 1 tablet by mouth daily.  . Omega-3 1000 MG CAPS Take by mouth.   No facility-administered encounter medications on file as of 02/14/2020.  :   Review of Systems:  Out of a complete 14 point review of systems, all are reviewed and negative with the exception of these symptoms as listed below:  Review of Systems  Neurological:       Here for evaluation on questionable bells palsy. Pt reports back in March he had right sided facial numbness and droop in smile.  Pt was treated with prednisone.  Objective:  Neurological Exam  Physical Exam Physical Examination:   Vitals:   02/14/20 1041  BP: 122/82  Pulse: 98  Temp: (!) 97.1 F (36.2 C)    General Examination: The patient is a very pleasant 39 y.o. male in no acute distress. He appears well-developed and well-nourished and well groomed.  He is in a wheelchair, reports that he has cellulitis on the left leg and is on antibiotic treatment for this.  He did not bring a walker.  HEENT: Normocephalic, atraumatic, pupils are equal, round and reactive to light and accommodation. Funduscopic exam is normal with sharp disc margins noted. Extraocular tracking is good without limitation to gaze excursion or nystagmus noted. Normal smooth pursuit is noted. Hearing is grossly intact. Tympanic membranes are clear bilaterally. Face is symmetric with normal facial animation and normal facial sensation.  He has a slightly droopy left eyelid which appears to be chronic.  I saw a picture of his admission to the rehab facility from 2018 which showed a more prominent left eyelid droop at the time.  He has no evidence of Bell's phenomenon with eye closure, good full eye closure, face is without  weakness, good forehead wrinkling.  No evidence of right facial weakness. speech is clear with no dysarthria noted. There is no hypophonia. There is no lip, neck/head, jaw or voice tremor. Neck is supple with full range of passive and active motion. There are no carotid bruits on auscultation. Oropharynx exam reveals: mild mouth dryness, adequate dental hygiene. Tongue protrudes centrally and palate elevates symmetrically.   Chest: Clear to auscultation without wheezing, rhonchi or crackles noted.  Heart: S1+S2+0, regular and normal without murmurs, rubs or gallops noted.   Abdomen: Soft, non-tender and non-distended with normal bowel sounds appreciated on auscultation.  Extremities:  There is swelling in the left lower extremity, he wears compression stockings up to the knees bilaterally.  He has surgical scar on the right knee.    Skin: Warm and dry otherwise.   Musculoskeletal: exam reveals no obvious joint deformities, tenderness or joint swelling or erythema.   Neurologically:  Mental status: The patient is awake, alert and oriented in all 4 spheres. His immediate and remote memory, attention, language skills and fund of knowledge are appropriate. There is no evidence of aphasia, agnosia, apraxia or anomia. Speech is clear with normal prosody and enunciation. Thought process is linear. Mood is constricted and affect is blunted.  Cranial nerves II - XII are as described above under HEENT exam. In addition: shoulder shrug is normal with equal shoulder height noted. Motor exam: Normal bulk, strength and tone is noted in the UEs.  Limited range of motion in the right lower extremity and distal left lower extremity, right leg weakness in the 4+ out of 5 range.  Reflexes are 1+ in the upper extremities, absent in the lower extremities, right knee not tested.  Romberg is not tested for safety reasons.  He usually walks with a walker he states.  Sensory exam is intact to light touch.  No obvious  dysmetria.  I did not have him stand or walk for me as he does not have a walker here.  Assessment and Plan:   In summary, Walter Hansen is a very pleasant 39 y.o.-year old male with an underlying medical history of diabetes, vitamin D deficiency, osteomyelitis, multiple bony injuries 25 right leg, cholecystitis, status post ORIF of right ankle and patella, currently on treatment for cellulitis of the left leg, and morbid obesity,  who presents for evaluation of his right facial weakness which was noted in March.  He was treated for suspected Bell's palsy with a round of prednisone.  He reports that he improved after a couple of weeks.  On examination, there are no telltale signs of Bell's palsy.  He does not have any facial weakness, no one-sided hemibody weakness or numbness.  He is largely reassured.  Nevertheless, I would like to proceed with a brain MRI with and without contrast to rule out a structural cause of his symptoms.  He is advised that we will call him with his MRI results and so long as it shows no acute findings, I can see him back if needed.   Thank you very much for allowing me to participate in the care of this nice patient. If I can be of any further assistance to you please do not hesitate to call me at [phone].  Sincerely,   Star Age, MD, PhD

## 2020-02-14 NOTE — Telephone Encounter (Signed)
Medicare/medicaid order sent to GI. No auth they will reach out to the patient to schedule.  °

## 2020-02-15 ENCOUNTER — Telehealth: Payer: Self-pay

## 2020-02-15 ENCOUNTER — Telehealth: Payer: Self-pay | Admitting: Neurology

## 2020-02-15 LAB — COMPREHENSIVE METABOLIC PANEL
ALT: 18 IU/L (ref 0–44)
AST: 16 IU/L (ref 0–40)
Albumin/Globulin Ratio: 1.1 — ABNORMAL LOW (ref 1.2–2.2)
Albumin: 4.1 g/dL (ref 4.0–5.0)
Alkaline Phosphatase: 60 IU/L (ref 39–117)
BUN/Creatinine Ratio: 12 (ref 9–20)
BUN: 21 mg/dL — ABNORMAL HIGH (ref 6–20)
Bilirubin Total: 0.4 mg/dL (ref 0.0–1.2)
CO2: 24 mmol/L (ref 20–29)
Calcium: 10 mg/dL (ref 8.7–10.2)
Chloride: 102 mmol/L (ref 96–106)
Creatinine, Ser: 1.81 mg/dL — ABNORMAL HIGH (ref 0.76–1.27)
GFR calc Af Amer: 54 mL/min/{1.73_m2} — ABNORMAL LOW (ref >59)
GFR calc non Af Amer: 46 mL/min/{1.73_m2} — ABNORMAL LOW (ref >59)
Globulin, Total: 3.6 g/dL (ref 1.5–4.5)
Glucose: 119 mg/dL — ABNORMAL HIGH (ref 65–99)
Potassium: 5 mmol/L (ref 3.5–5.2)
Sodium: 141 mmol/L (ref 134–144)
Total Protein: 7.7 g/dL (ref 6.0–8.5)

## 2020-02-15 NOTE — Progress Notes (Signed)
Please advise patient that his kidney function is impaired. We will not be able to do the brain MRI w contrast and only without contrast; please advise him to make a FU appointment with his primary care as soon as possible to discuss kidney function and further evaluation. In our system, we don't have recent comparison values; his last kidney function was nearly 2 years ago and was normal then. There are no labs in outside records [facility]) either.

## 2020-02-15 NOTE — Telephone Encounter (Signed)
-----   Message from Huston Foley, MD sent at 02/15/2020  7:16 AM EDT ----- Please advise patient that his kidney function is impaired. We will not be able to do the brain MRI w contrast and only without contrast; please advise him to make a FU appointment with his primary care as soon as possible to discuss kidney function and further evaluation. In our system, we don't have recent comparison values; his last kidney function was nearly 2 years ago and was normal then. There are no labs in outside records [facility]) either.

## 2020-02-15 NOTE — Telephone Encounter (Signed)
Attempted to reach the pt.  Pt's number was busy at the time of call. Will try again later.

## 2020-02-15 NOTE — Telephone Encounter (Signed)
Medicare/medicaid order sent to GI. No auth they will reach out to the patient to schedule.  °

## 2020-02-15 NOTE — Addendum Note (Signed)
Addended by: Huston Foley on: 02/15/2020 07:18 AM   Modules accepted: Orders

## 2020-02-20 NOTE — Telephone Encounter (Signed)
Attempted to reach the pt.  Pt's number was busy at the time of call. Will try again later.  

## 2020-02-21 NOTE — Telephone Encounter (Signed)
Attempted to reach the pt. # was busy at the time of call. This is our 3rd attempt. Will mail letter asking pt to call so we could review lab results.

## 2020-10-22 ENCOUNTER — Other Ambulatory Visit: Payer: Self-pay | Admitting: *Deleted

## 2020-10-22 DIAGNOSIS — I739 Peripheral vascular disease, unspecified: Secondary | ICD-10-CM

## 2020-10-25 ENCOUNTER — Encounter (HOSPITAL_COMMUNITY): Payer: Medicare Other

## 2020-10-25 ENCOUNTER — Ambulatory Visit (HOSPITAL_COMMUNITY)
Admission: RE | Admit: 2020-10-25 | Discharge: 2020-10-25 | Disposition: A | Discharge status: Home / Self Care | Payer: Medicare Other | Source: Ambulatory Visit | Attending: Vascular Surgery | Admitting: Vascular Surgery

## 2020-10-25 ENCOUNTER — Other Ambulatory Visit: Payer: Self-pay

## 2020-10-25 ENCOUNTER — Ambulatory Visit (INDEPENDENT_AMBULATORY_CARE_PROVIDER_SITE_OTHER): Payer: Medicare Other | Admitting: Physician Assistant

## 2020-10-25 VITALS — BP 107/75 | HR 96 | Resp 16 | Ht 75.0 in | Wt 266.6 lb

## 2020-10-25 DIAGNOSIS — R600 Localized edema: Secondary | ICD-10-CM | POA: Diagnosis not present

## 2020-10-25 DIAGNOSIS — T8189XS Other complications of procedures, not elsewhere classified, sequela: Secondary | ICD-10-CM | POA: Diagnosis not present

## 2020-10-25 DIAGNOSIS — I739 Peripheral vascular disease, unspecified: Secondary | ICD-10-CM | POA: Insufficient documentation
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
## 2020-10-28 ENCOUNTER — Encounter (HOSPITAL_COMMUNITY): Payer: Medicare Other

## 2022-12-09 DIAGNOSIS — K580 Irritable bowel syndrome with diarrhea: Secondary | ICD-10-CM | POA: Insufficient documentation

## 2023-04-08 ENCOUNTER — Ambulatory Visit: Payer: Medicare Other | Admitting: Podiatry

## 2023-04-21 ENCOUNTER — Ambulatory Visit (INDEPENDENT_AMBULATORY_CARE_PROVIDER_SITE_OTHER): Payer: Medicare Other | Admitting: Podiatry

## 2023-04-21 DIAGNOSIS — M79675 Pain in left toe(s): Secondary | ICD-10-CM | POA: Diagnosis not present

## 2023-04-21 DIAGNOSIS — B351 Tinea unguium: Secondary | ICD-10-CM

## 2023-04-21 DIAGNOSIS — L989 Disorder of the skin and subcutaneous tissue, unspecified: Secondary | ICD-10-CM | POA: Diagnosis not present

## 2023-04-21 DIAGNOSIS — B070 Plantar wart: Secondary | ICD-10-CM

## 2023-04-21 DIAGNOSIS — M79674 Pain in right toe(s): Secondary | ICD-10-CM | POA: Diagnosis not present

## 2023-04-21 MED ORDER — CICLOPIROX 8 % EX SOLN: Freq: Every day | CUTANEOUS | 11 refills | Status: AC

## 2023-04-21 NOTE — Progress Notes (Signed)
Subjective:  Patient ID: Walter Hansen, male    DOB: [birth year],  MRN: [id number]   Walter Hansen presents to clinic today for:  Chief Complaint  Patient presents with   Nail Problem    Diabetic Foot Care- nail tim    Callouses    Callus to ball of left foot.   . Patient notes nails are thick, discolored, elongated and painful in shoegear when trying to ambulate.  Patient resides in a living facility with nursing care.  On the bottom of the left foot there is a painful skin lesion present.  He states that he has not been treating it with anything but the staff at the facility may have.  He wants to treat the nail fungus.  PCP is Hague, [name], MD.  Allergies  Allergen Reactions   Penicillins Other (See Comments)    Testicles swell up Testicles swell up   Review of Systems: Negative except as noted in the HPI.  Objective:  There were no vitals filed for this visit.  Walter Hansen is a pleasant 42 y.o. male in NAD. AAO x 3.  Vascular Examination: Capillary refill time is 3-5 seconds to toes bilateral. Palpable pedal pulses b/l LE. Digital hair present b/l. No pedal edema b/l. Skin temperature gradient WNL b/l. No varicosities b/l. No cyanosis or clubbing noted b/l.   Dermatological Examination: Pedal skin with normal turgor, texture and tone b/l. No open wounds. No interdigital macerations b/l. Toenails x10 are 3mm thick, discolored, dystrophic with subungual debris. There is pain with compression of the nail plates.  They are elongated x10.  There is a hyperkeratotic lesion submet 4 area left foot with pain on palpation.  It is discolored white which appears to be from previous topical acid application.  There are black pinpoint dots seen upon shaving of the lesion.  This is most likely consistent with verruca however the recent acid applications make it difficult to discern with 100% certainty  Neurological Examination: Protective sensation  intact bilateral LE. Vibratory sensation intact bilateral LE.  Musculoskeletal Examination: Muscle strength 5/5 to all LE muscle groups b/l.   Assessment/Plan: 1. Dermatophytosis of nail   2. Plantar wart     Meds ordered this encounter  Medications   ciclopirox (PENLAC) 8 % solution    Sig: Apply topically at bedtime. Apply over nail and surrounding skin. Apply daily over previous coat. Remove weekly with polish remover.    Dispense:  6.6 mL    Refill:  11   The mycotic toenails were sharply debrided x10 with sterile nail nippers and a power debriding burr to decrease bulk/thickness and length.  Will start patient on topical ciclopirox nail lacquer to apply once daily to the toenails.  Let the solution dry for 5 to 10 minutes before applying socks.  Once weekly, he needs to remove the lacquer buildup with either nail polish remover or rubbing alcohol solution.  He then can begin daily application for the next week again.  The painful skin lesion/verruca was shaved of hyperkeratotic tissue.  Cantharone Plus was applied to the skin lesion today.  A Band-Aid was applied.  He can remove the Band-Aid in 4 to 6 hours.  No other treatment at home for this is necessary.  Return in about 3 months (around 07/22/2023) for [facility] and recheck wart left foot.   Clerance Lav, DPM, FACFAS [facility]     [address]  [place], [place] [address]                Office [phone]  Fax [phone]

## 2023-07-21 ENCOUNTER — Ambulatory Visit: Payer: Medicare Other | Admitting: Podiatry

## 2023-08-02 ENCOUNTER — Ambulatory Visit: Payer: Medicare Other | Admitting: Podiatry

## 2023-08-02 ENCOUNTER — Encounter: Payer: Self-pay | Admitting: Podiatry

## 2023-08-02 DIAGNOSIS — B351 Tinea unguium: Secondary | ICD-10-CM | POA: Diagnosis not present

## 2023-08-02 DIAGNOSIS — L84 Corns and callosities: Secondary | ICD-10-CM

## 2023-08-02 DIAGNOSIS — M79674 Pain in right toe(s): Secondary | ICD-10-CM | POA: Diagnosis not present

## 2023-08-02 DIAGNOSIS — E1142 Type 2 diabetes mellitus with diabetic polyneuropathy: Secondary | ICD-10-CM

## 2023-08-02 DIAGNOSIS — M79675 Pain in left toe(s): Secondary | ICD-10-CM | POA: Diagnosis not present

## 2023-08-02 NOTE — Progress Notes (Signed)
  Subjective:  Patient ID: Walter Hansen, male    DOB: [birth year],  MRN: [id number]   42 y.o. male presents for follow-up on left plantar forefoot callus which was previously treated as a wart.  Also here for nail fungal infection and nail dystrophy. History confirmed with patient. Patient presenting with pain related to dystrophic thickened elongated nails. Patient is unable to trim own nails related to nail dystrophy and/or mobility issues. Patient does  have a history of T2DM.  Has been applying Penlac to all nails but it does not appear that has been getting removed.  Large callus present on the left plantar forefoot.  Where he is staying is putting a pad over it daily.  Objective:  Physical Exam: warm, good capillary refill nail exam onychomycosis of the toenails, onycholysis, dystrophic nails, and nails are thickened and coated with Penlac which are white in color.  Penlac peels off with debridement. DP pulses palpable, PT pulses palpable, and protective sensation absent Left Foot:  Pain with palpation of nails due to elongation and dystrophic growth.  Large hyperkeratotic lesion subfourth metatarsal head on the left foot.  There is no underlying ulceration no pinpoint bleeding upon debridement.  Central area of dark dried blood or eschar at the center but no obvious changes to indicate wart Right Foot: Pain with palpation of nails due to elongation and dystrophic growth.   Assessment:  No diagnosis found.   Plan:  Patient was evaluated and treated and all questions answered.  #Hyperkeratotic lesions/pre ulcerative calluses present subfourth metatarsal head left foot All symptomatic hyperkeratoses x 1 separate lesions were safely debrided with a sterile #10 blade to patient's level of comfort without incident. We discussed preventative and palliative care of these lesions including supportive and accommodative shoegear, padding, prefabricated and custom molded accommodative  orthoses, use of a pumice stone and lotions/creams daily.  #Onychomycosis with pain  -Nails palliatively debrided as below. -Educated on self-care  Procedure: Nail Debridement Rationale: Pain Type of Debridement: manual, sharp debridement. Instrumentation: Nail nipper, rotary burr. Number of Nails: 10  Return for [facility] w Dr. Carlota Raspberry.         Corinna Gab, DPM [facility] / [facility]

## 2023-08-02 NOTE — Progress Notes (Signed)
  Subjective:  Patient ID: Walter Hansen, male    DOB: April 03, 1981,  MRN: [id number]   42 y.o. male presents for follow-up on left plantar forefoot callus which was previously treated as a wart.  Also here for nail fungal infection and nail dystrophy. History confirmed with patient. Patient presenting with pain related to dystrophic thickened elongated nails. Patient is unable to trim own nails related to nail dystrophy and/or mobility issues. Patient does  have a history of T2DM.  Has been applying Penlac to all nails but it does not appear that has been getting removed.  Large callus present on the left plantar forefoot.  Where he is staying is putting a pad over it daily.  Objective:  Physical Exam: warm, good capillary refill nail exam onychomycosis of the toenails, onycholysis, dystrophic nails, and nails are thickened and coated with Penlac which are white in color.  Penlac peels off with debridement. DP pulses palpable, PT pulses palpable, and protective sensation absent Left Foot:  Pain with palpation of nails due to elongation and dystrophic growth.  Large hyperkeratotic lesion subfourth metatarsal head on the left foot.  There is no underlying ulceration no pinpoint bleeding upon debridement.  Central area of dark dried blood or eschar at the center but no obvious changes to indicate wart Right Foot: Pain with palpation of nails due to elongation and dystrophic growth.   Assessment:   1. Callus of foot   2. Pain due to onychomycosis of toenails of both feet   3. DM type 2 with diabetic peripheral neuropathy (HCC)      Plan:  Patient was evaluated and treated and all questions answered.  #Hyperkeratotic lesions/pre ulcerative calluses present subfourth metatarsal head left foot All symptomatic hyperkeratoses x 1 separate lesions were safely debrided with a sterile #10 blade to patient's level of comfort without incident. We discussed preventative and palliative care of these  lesions including supportive and accommodative shoegear, padding, prefabricated and custom molded accommodative orthoses, use of a pumice stone and lotions/creams daily.  #Onychomycosis with pain  -Nails palliatively debrided as below. -Educated on self-care  Procedure: Nail Debridement Rationale: Pain Type of Debridement: manual, sharp debridement. Instrumentation: Nail nipper, rotary burr. Number of Nails: 10  Return for [facility] w Dr. Carlota Raspberry.         Corinna Gab, DPM [facility] / [facility]

## 2023-11-03 ENCOUNTER — Encounter: Payer: Medicare Other | Admitting: Podiatry

## 2023-11-06 NOTE — Progress Notes (Signed)
Patient was a no-show for today's scheduled appointment on 11/03/2023.
